# Patient Record
Sex: Female | Born: 1940 | Race: White | Hispanic: No | Smoking: Current every day smoker
Health system: Southern US, Community
[De-identification: ages and names within clinical notes are randomized; demographics above are authoritative.]

## PROBLEM LIST (undated history)

## (undated) DIAGNOSIS — N1832 Chronic kidney disease, stage 3b: Secondary | ICD-10-CM

## (undated) DIAGNOSIS — F17200 Nicotine dependence, unspecified, uncomplicated: Secondary | ICD-10-CM

## (undated) DIAGNOSIS — I10 Essential (primary) hypertension: Secondary | ICD-10-CM

## (undated) DIAGNOSIS — E119 Type 2 diabetes mellitus without complications: Secondary | ICD-10-CM

## (undated) DIAGNOSIS — E785 Hyperlipidemia, unspecified: Secondary | ICD-10-CM

---

## 2000-01-08 ENCOUNTER — Encounter: Payer: Self-pay | Admitting: Family Medicine

## 2000-01-08 ENCOUNTER — Ambulatory Visit (HOSPITAL_COMMUNITY): Admission: RE | Admit: 2000-01-08 | Discharge: 2000-01-08 | Payer: Self-pay | Admitting: Family Medicine

## 2000-01-19 ENCOUNTER — Encounter: Payer: Self-pay | Admitting: *Deleted

## 2000-01-19 ENCOUNTER — Ambulatory Visit (HOSPITAL_COMMUNITY): Admission: RE | Admit: 2000-01-19 | Discharge: 2000-01-19 | Payer: Self-pay | Admitting: *Deleted

## 2020-12-25 DIAGNOSIS — I10 Essential (primary) hypertension: Secondary | ICD-10-CM | POA: Diagnosis not present

## 2020-12-25 DIAGNOSIS — E7800 Pure hypercholesterolemia, unspecified: Secondary | ICD-10-CM | POA: Diagnosis not present

## 2020-12-25 DIAGNOSIS — E1122 Type 2 diabetes mellitus with diabetic chronic kidney disease: Secondary | ICD-10-CM | POA: Diagnosis not present

## 2020-12-25 DIAGNOSIS — N1832 Chronic kidney disease, stage 3b: Secondary | ICD-10-CM | POA: Diagnosis not present

## 2020-12-25 DIAGNOSIS — Z0000 Encounter for general adult medical examination without abnormal findings: Secondary | ICD-10-CM | POA: Diagnosis not present

## 2020-12-25 DIAGNOSIS — F17200 Nicotine dependence, unspecified, uncomplicated: Secondary | ICD-10-CM | POA: Diagnosis not present

## 2020-12-25 DIAGNOSIS — M150 Primary generalized (osteo)arthritis: Secondary | ICD-10-CM | POA: Diagnosis not present

## 2021-08-08 DIAGNOSIS — E1122 Type 2 diabetes mellitus with diabetic chronic kidney disease: Secondary | ICD-10-CM | POA: Diagnosis not present

## 2021-08-08 DIAGNOSIS — I10 Essential (primary) hypertension: Secondary | ICD-10-CM | POA: Diagnosis not present

## 2021-08-08 DIAGNOSIS — N183 Chronic kidney disease, stage 3 unspecified: Secondary | ICD-10-CM | POA: Diagnosis not present

## 2021-08-08 DIAGNOSIS — E7800 Pure hypercholesterolemia, unspecified: Secondary | ICD-10-CM | POA: Diagnosis not present

## 2021-08-08 DIAGNOSIS — F17200 Nicotine dependence, unspecified, uncomplicated: Secondary | ICD-10-CM | POA: Diagnosis not present

## 2021-08-08 DIAGNOSIS — M150 Primary generalized (osteo)arthritis: Secondary | ICD-10-CM | POA: Diagnosis not present

## 2021-12-27 ENCOUNTER — Emergency Department (HOSPITAL_BASED_OUTPATIENT_CLINIC_OR_DEPARTMENT_OTHER)
Admission: EM | Admit: 2021-12-27 | Discharge: 2021-12-27 | Disposition: A | Discharge status: Home / Self Care | Payer: Medicare HMO | Attending: Emergency Medicine | Admitting: Emergency Medicine

## 2021-12-27 ENCOUNTER — Emergency Department (HOSPITAL_BASED_OUTPATIENT_CLINIC_OR_DEPARTMENT_OTHER): Payer: Medicare HMO | Admitting: Radiology

## 2021-12-27 ENCOUNTER — Emergency Department (HOSPITAL_BASED_OUTPATIENT_CLINIC_OR_DEPARTMENT_OTHER): Payer: Medicare HMO

## 2021-12-27 DIAGNOSIS — M25532 Pain in left wrist: Secondary | ICD-10-CM | POA: Diagnosis not present

## 2021-12-27 DIAGNOSIS — S0083XA Contusion of other part of head, initial encounter: Secondary | ICD-10-CM | POA: Diagnosis not present

## 2021-12-27 DIAGNOSIS — I10 Essential (primary) hypertension: Secondary | ICD-10-CM | POA: Diagnosis not present

## 2021-12-27 DIAGNOSIS — S52532A Colles' fracture of left radius, initial encounter for closed fracture: Secondary | ICD-10-CM | POA: Insufficient documentation

## 2021-12-27 DIAGNOSIS — W19XXXA Unspecified fall, initial encounter: Secondary | ICD-10-CM | POA: Diagnosis not present

## 2021-12-27 DIAGNOSIS — S52612A Displaced fracture of left ulna styloid process, initial encounter for closed fracture: Secondary | ICD-10-CM | POA: Diagnosis not present

## 2021-12-27 DIAGNOSIS — J320 Chronic maxillary sinusitis: Secondary | ICD-10-CM | POA: Insufficient documentation

## 2021-12-27 DIAGNOSIS — S52352A Displaced comminuted fracture of shaft of radius, left arm, initial encounter for closed fracture: Secondary | ICD-10-CM | POA: Diagnosis not present

## 2021-12-27 DIAGNOSIS — S6992XA Unspecified injury of left wrist, hand and finger(s), initial encounter: Secondary | ICD-10-CM | POA: Diagnosis present

## 2021-12-27 DIAGNOSIS — R609 Edema, unspecified: Secondary | ICD-10-CM | POA: Diagnosis not present

## 2021-12-27 DIAGNOSIS — I959 Hypotension, unspecified: Secondary | ICD-10-CM | POA: Diagnosis not present

## 2021-12-27 DIAGNOSIS — W010XXA Fall on same level from slipping, tripping and stumbling without subsequent striking against object, initial encounter: Secondary | ICD-10-CM | POA: Diagnosis not present

## 2021-12-27 DIAGNOSIS — S0990XA Unspecified injury of head, initial encounter: Secondary | ICD-10-CM | POA: Diagnosis not present

## 2021-12-27 MED ORDER — OXYCODONE HCL 5 MG PO TABS
5.0000 mg | ORAL_TABLET | Freq: Once | ORAL | Status: AC
  Administered 2021-12-27: 5 mg via ORAL
  Filled 2021-12-27: qty 1

## 2021-12-27 MED ORDER — ACETAMINOPHEN 500 MG PO TABS
1000.0000 mg | ORAL_TABLET | Freq: Once | ORAL | Status: AC
  Administered 2021-12-27: 1000 mg via ORAL
  Filled 2021-12-27: qty 2

## 2021-12-27 MED ORDER — KETOROLAC TROMETHAMINE 15 MG/ML IJ SOLN
15.0000 mg | Freq: Once | INTRAMUSCULAR | Status: AC
Start: 2021-12-27 — End: 2021-12-27
  Administered 2021-12-27: 15 mg via INTRAMUSCULAR
  Filled 2021-12-27: qty 1

## 2021-12-27 NOTE — ED Triage Notes (Signed)
He states she lost her balance (didn't pass out), yesterday evening. Her perception at the time was that she lightly struck her left orbit area. She noted later that her left wrist was "sore; and it got worse and worse". Her left wrist is swollen and ecchymotic. She is awake, alert and oriented x 4 with clear speech.

## 2021-12-27 NOTE — ED Notes (Signed)
Pt discharged home after verbalizing understanding of discharge instructions; nad noted. 

## 2021-12-27 NOTE — ED Notes (Signed)
Pt taken to the restroom with a wheelchair.

## 2021-12-27 NOTE — ED Provider Notes (Signed)
[facility] EMERGENCY DEPT Provider Note   CSN: [id number] Arrival date & time: 12/27/21  1005     History  Chief Complaint  Patient presents with   Fall/wrist pain    Kristin Berry is a 81 y.o. female.  81 yo F with a chief complaints of left wrist pain.  Patient states that she lost her balance yesterday and fell and landed on both of her outstretched arms.  Planing mostly of pain to the left wrist.  She went to bed last night and woke up this morning and felt like it was significantly worse.  She also struck the left side of her face.  She denied confusion denied loss consciousness denied vomiting.  Denies neck pain.  Denies double vision.       Home Medications Prior to Admission medications   Not on File      Allergies    Patient has no allergy information on record.    Review of Systems   Review of Systems  Physical Exam Updated Vital Signs BP (!) 169/59 (BP Location: Right Arm)    Pulse 70    Temp 98.6 F (37 C) (Oral)    Resp 15    LMP  (LMP Unknown)    SpO2 99%  Physical Exam Vitals and nursing note reviewed.  Constitutional:      General: She is not in acute distress.    Appearance: She is well-developed. She is not diaphoretic.  HENT:     Head: Normocephalic.     Comments: Bruising to the superior aspect of the left brow.  No obvious crepitus.  Extraocular motion intact.  Pupils equal round and reactive to light accommodation. Eyes:     Pupils: Pupils are equal, round, and reactive to light.  Cardiovascular:     Rate and Rhythm: Normal rate and regular rhythm.     Heart sounds: No murmur heard.   No friction rub. No gallop.  Pulmonary:     Effort: Pulmonary effort is normal.     Breath sounds: No wheezing or rales.  Abdominal:     General: There is no distension.     Palpations: Abdomen is soft.     Tenderness: There is no abdominal tenderness.  Musculoskeletal:        General: Swelling, tenderness and deformity present.      Cervical back: Normal range of motion and neck supple.     Comments: Pain and swelling to the left wrist.  Pulse motor and sensation intact distally.  No pain at the elbow.  Skin:    General: Skin is warm and dry.  Neurological:     Mental Status: She is alert and oriented to person, place, and time.  Psychiatric:        Behavior: Behavior normal.    ED Results / Procedures / Treatments   Labs (all labs ordered are listed, but only abnormal results are displayed) Labs Reviewed - No data to display  EKG None  Radiology DG Wrist Complete Left  Result Date: 12/27/2021 CLINICAL DATA:  Trauma, fall EXAM: LEFT WRIST - COMPLETE 3+ VIEW COMPARISON:  None. FINDINGS: There is severely comminuted fracture in the distal radius. Fracture lines are extending to the articular surface. There is some over riding of fracture fragments in the dorsal aspect. Fracture is seen in the ulnar styloid. Degenerative changes are noted in first carpometacarpal joint. IMPRESSION: Comminuted displaced fracture is seen in the distal left radius. Fracture lines are involving the articular surface.  There is avulsion fracture of ulnar styloid. Electronically Signed   By: Elmer Picker M.D.   On: 12/27/2021 10:59   CT Head Wo Contrast  Result Date: 12/27/2021 CLINICAL DATA:  Trauma, fall EXAM: CT HEAD WITHOUT CONTRAST TECHNIQUE: Contiguous axial images were obtained from the base of the skull through the vertex without intravenous contrast. RADIATION DOSE REDUCTION: This exam was performed according to the departmental dose-optimization program which includes automated exposure control, adjustment of the mA and/or kV according to patient size and/or use of iterative reconstruction technique. COMPARISON:  None. FINDINGS: Brain: No acute intracranial findings are seen. Cortical sulci are prominent. There is decreased density in the periventricular white matter. Vascular: Unremarkable. Skull: No fracture is seen.  Sinuses/Orbits: There is mucosal thickening in the left maxillary sinus. Other: There is increased amount of CSF insula suggesting partial empty sella. IMPRESSION: No acute intracranial findings are seen in noncontrast CT brain. Atrophy. Chronic left maxillary sinusitis. Electronically Signed   By: Elmer Picker M.D.   On: 12/27/2021 11:01    Procedures Procedures  SPLINT APPLICATION Date/Time: [id number] PM Authorized by: Cecilio Asper Consent: Verbal consent obtained. Risks and benefits: risks, benefits and alternatives were discussed Consent given by: patient Splint applied by: EMT Location details: L wrist Splint type: sugar tong Supplies used: orthoglass Post-procedure: The splinted body part was neurovascularly unchanged following the procedure. Patient tolerance: Patient tolerated the procedure well with no immediate complications.     Medications Ordered in ED Medications  acetaminophen (TYLENOL) tablet 1,000 mg (1,000 mg Oral Given 12/27/21 1107)  oxyCODONE (Oxy IR/ROXICODONE) immediate release tablet 5 mg (5 mg Oral Given 12/27/21 1107)  ketorolac (TORADOL) 15 MG/ML injection 15 mg (15 mg Intramuscular Given 12/27/21 1126)    ED Course/ Medical Decision Making/ A&P                           Medical Decision Making Amount and/or Complexity of Data Reviewed Radiology: ordered.  Risk OTC drugs. Prescription drug management.   Patient is a 81 y.o. female with a cc of a fall.  Patient states that she lost her balance and landed on outstretched arms.  Complaining mostly of left wrist pain.  Plain film viewed by me and independently interpreted with distal radius fracture without significant displacement.  We will place in a sugar-tong splint here.  Hand follow-up.  She also has some signs of head trauma.  She denies any significant symptoms.  CT of the head was negative for acute intracranial pathology.  We will discharge the patient home.  PCP follow-up.  PDMP  reviewed.  Patient does have a history of chronic narcotic use.  We will not add any pain medicine to her regimen at this time.  12:25 PM:  I have discussed the diagnosis/risks/treatment options with the patient.  Evaluation and diagnostic testing in the emergency department does not suggest an emergent condition requiring admission or immediate intervention beyond what has been performed at this time.  They will follow up with  PCP, hand. We also discussed returning to the ED immediately if new or worsening sx occur. We discussed the sx which are most concerning (e.g., sudden worsening pain, fever, inability to tolerate by mouth) that necessitate immediate return. Medications administered to the patient during their visit and any new prescriptions provided to the patient are listed below.  Medications given during this visit Medications  acetaminophen (TYLENOL) tablet 1,000 mg (1,000 mg Oral Given 12/27/21 1107)  oxyCODONE (Oxy IR/ROXICODONE) immediate release tablet 5 mg (5 mg Oral Given 12/27/21 1107)  ketorolac (TORADOL) 15 MG/ML injection 15 mg (15 mg Intramuscular Given 12/27/21 1126)     The patient appears reasonably screen and/or stabilized for discharge and I doubt any other medical condition or other [facility] requiring further screening, evaluation, or treatment in the ED at this time prior to discharge.           Final Clinical Impression(s) / ED Diagnoses Final diagnoses:  Closed Colles' fracture of left radius, initial encounter    Rx / DC Orders ED Discharge Orders     None         Deno Etienne, DO 12/27/21 1225

## 2021-12-27 NOTE — Discharge Instructions (Addendum)
Take 4 over the counter ibuprofen tablets 3 times a day or 2 over-the-counter naproxen tablets twice a day for pain.  Then take the pain medicine if you feel like you need it. Narcotics do not help with the pain, they only make you care about it less.  You can become addicted to this, people may break into your house to steal it.  It will constipate you.  If you drive under the influence of this medicine you can get a DUI.    

## 2022-01-01 ENCOUNTER — Other Ambulatory Visit: Payer: Self-pay | Admitting: Family Medicine

## 2022-01-01 DIAGNOSIS — S52539S Colles' fracture of unspecified radius, sequela: Secondary | ICD-10-CM

## 2022-01-02 ENCOUNTER — Emergency Department (HOSPITAL_COMMUNITY): Payer: Medicare HMO

## 2022-01-02 ENCOUNTER — Encounter (HOSPITAL_COMMUNITY): Payer: Self-pay | Admitting: Internal Medicine

## 2022-01-02 ENCOUNTER — Other Ambulatory Visit: Payer: Self-pay

## 2022-01-02 ENCOUNTER — Inpatient Hospital Stay (HOSPITAL_COMMUNITY)
Admission: EM | Admit: 2022-01-02 | Discharge: 2022-01-05 | DRG: 690 | Disposition: A | Discharge status: Home Health | Payer: Medicare HMO | Attending: Internal Medicine | Admitting: Internal Medicine

## 2022-01-02 DIAGNOSIS — E041 Nontoxic single thyroid nodule: Secondary | ICD-10-CM | POA: Diagnosis not present

## 2022-01-02 DIAGNOSIS — Z20822 Contact with and (suspected) exposure to covid-19: Secondary | ICD-10-CM | POA: Diagnosis present

## 2022-01-02 DIAGNOSIS — M47812 Spondylosis without myelopathy or radiculopathy, cervical region: Secondary | ICD-10-CM | POA: Diagnosis not present

## 2022-01-02 DIAGNOSIS — E1122 Type 2 diabetes mellitus with diabetic chronic kidney disease: Secondary | ICD-10-CM | POA: Diagnosis not present

## 2022-01-02 DIAGNOSIS — G319 Degenerative disease of nervous system, unspecified: Secondary | ICD-10-CM | POA: Diagnosis not present

## 2022-01-02 DIAGNOSIS — Z66 Do not resuscitate: Secondary | ICD-10-CM | POA: Diagnosis present

## 2022-01-02 DIAGNOSIS — S199XXA Unspecified injury of neck, initial encounter: Secondary | ICD-10-CM | POA: Diagnosis not present

## 2022-01-02 DIAGNOSIS — S52539A Colles' fracture of unspecified radius, initial encounter for closed fracture: Secondary | ICD-10-CM | POA: Diagnosis present

## 2022-01-02 DIAGNOSIS — W010XXA Fall on same level from slipping, tripping and stumbling without subsequent striking against object, initial encounter: Secondary | ICD-10-CM | POA: Diagnosis present

## 2022-01-02 DIAGNOSIS — R269 Unspecified abnormalities of gait and mobility: Secondary | ICD-10-CM | POA: Diagnosis present

## 2022-01-02 DIAGNOSIS — M25572 Pain in left ankle and joints of left foot: Secondary | ICD-10-CM | POA: Diagnosis not present

## 2022-01-02 DIAGNOSIS — I129 Hypertensive chronic kidney disease with stage 1 through stage 4 chronic kidney disease, or unspecified chronic kidney disease: Secondary | ICD-10-CM | POA: Diagnosis present

## 2022-01-02 DIAGNOSIS — I959 Hypotension, unspecified: Secondary | ICD-10-CM | POA: Diagnosis not present

## 2022-01-02 DIAGNOSIS — J3489 Other specified disorders of nose and nasal sinuses: Secondary | ICD-10-CM | POA: Diagnosis not present

## 2022-01-02 DIAGNOSIS — F17210 Nicotine dependence, cigarettes, uncomplicated: Secondary | ICD-10-CM | POA: Diagnosis present

## 2022-01-02 DIAGNOSIS — N179 Acute kidney failure, unspecified: Secondary | ICD-10-CM | POA: Diagnosis present

## 2022-01-02 DIAGNOSIS — N390 Urinary tract infection, site not specified: Principal | ICD-10-CM | POA: Diagnosis present

## 2022-01-02 DIAGNOSIS — S52532A Colles' fracture of left radius, initial encounter for closed fracture: Secondary | ICD-10-CM | POA: Diagnosis present

## 2022-01-02 DIAGNOSIS — R296 Repeated falls: Secondary | ICD-10-CM | POA: Diagnosis present

## 2022-01-02 DIAGNOSIS — R262 Difficulty in walking, not elsewhere classified: Secondary | ICD-10-CM | POA: Diagnosis present

## 2022-01-02 DIAGNOSIS — N1832 Chronic kidney disease, stage 3b: Secondary | ICD-10-CM | POA: Diagnosis present

## 2022-01-02 DIAGNOSIS — Z885 Allergy status to narcotic agent status: Secondary | ICD-10-CM | POA: Diagnosis not present

## 2022-01-02 DIAGNOSIS — D631 Anemia in chronic kidney disease: Secondary | ICD-10-CM | POA: Diagnosis present

## 2022-01-02 DIAGNOSIS — I700 Atherosclerosis of aorta: Secondary | ICD-10-CM | POA: Diagnosis not present

## 2022-01-02 DIAGNOSIS — E785 Hyperlipidemia, unspecified: Secondary | ICD-10-CM | POA: Diagnosis present

## 2022-01-02 DIAGNOSIS — F17200 Nicotine dependence, unspecified, uncomplicated: Secondary | ICD-10-CM | POA: Diagnosis present

## 2022-01-02 DIAGNOSIS — M25551 Pain in right hip: Secondary | ICD-10-CM | POA: Diagnosis not present

## 2022-01-02 DIAGNOSIS — W19XXXA Unspecified fall, initial encounter: Secondary | ICD-10-CM | POA: Diagnosis not present

## 2022-01-02 DIAGNOSIS — Z716 Tobacco abuse counseling: Secondary | ICD-10-CM | POA: Diagnosis not present

## 2022-01-02 DIAGNOSIS — I10 Essential (primary) hypertension: Secondary | ICD-10-CM | POA: Diagnosis present

## 2022-01-02 DIAGNOSIS — N189 Chronic kidney disease, unspecified: Secondary | ICD-10-CM | POA: Diagnosis present

## 2022-01-02 DIAGNOSIS — S0003XA Contusion of scalp, initial encounter: Secondary | ICD-10-CM | POA: Diagnosis not present

## 2022-01-02 DIAGNOSIS — J341 Cyst and mucocele of nose and nasal sinus: Secondary | ICD-10-CM | POA: Diagnosis not present

## 2022-01-02 DIAGNOSIS — R9431 Abnormal electrocardiogram [ECG] [EKG]: Secondary | ICD-10-CM | POA: Diagnosis not present

## 2022-01-02 DIAGNOSIS — E119 Type 2 diabetes mellitus without complications: Secondary | ICD-10-CM

## 2022-01-02 DIAGNOSIS — J439 Emphysema, unspecified: Secondary | ICD-10-CM | POA: Diagnosis not present

## 2022-01-02 HISTORY — DX: Nicotine dependence, unspecified, uncomplicated: F17.200

## 2022-01-02 HISTORY — DX: Essential (primary) hypertension: I10

## 2022-01-02 HISTORY — DX: Hyperlipidemia, unspecified: E78.5

## 2022-01-02 HISTORY — DX: Type 2 diabetes mellitus without complications: E11.9

## 2022-01-02 HISTORY — DX: Chronic kidney disease, stage 3b: N18.32

## 2022-01-02 LAB — COMPREHENSIVE METABOLIC PANEL
ALT: 13 U/L (ref 0–44)
AST: 14 U/L — ABNORMAL LOW (ref 15–41)
Albumin: 3.2 g/dL — ABNORMAL LOW (ref 3.5–5.0)
Alkaline Phosphatase: 101 U/L (ref 38–126)
Anion gap: 14 (ref 5–15)
BUN: 68 mg/dL — ABNORMAL HIGH (ref 8–23)
CO2: 14 mmol/L — ABNORMAL LOW (ref 22–32)
Calcium: 9.8 mg/dL (ref 8.9–10.3)
Chloride: 109 mmol/L (ref 98–111)
Creatinine, Ser: 2.82 mg/dL — ABNORMAL HIGH (ref 0.44–1.00)
GFR, Estimated: 16 mL/min — ABNORMAL LOW (ref >60)
Glucose, Bld: 192 mg/dL — ABNORMAL HIGH (ref 70–99)
Potassium: 3.9 mmol/L (ref 3.5–5.1)
Sodium: 137 mmol/L (ref 135–145)
Total Bilirubin: 0.5 mg/dL (ref 0.3–1.2)
Total Protein: 5.8 g/dL — ABNORMAL LOW (ref 6.5–8.1)

## 2022-01-02 LAB — CBC WITH DIFFERENTIAL/PLATELET
Abs Immature Granulocytes: 0.06 10*3/uL (ref 0.00–0.07)
Basophils Absolute: 0 10*3/uL (ref 0.0–0.1)
Basophils Relative: 0 %
Eosinophils Absolute: 0 10*3/uL (ref 0.0–0.5)
Eosinophils Relative: 0 %
HCT: 28.5 % — ABNORMAL LOW (ref 36.0–46.0)
Hemoglobin: 9.5 g/dL — ABNORMAL LOW (ref 12.0–15.0)
Immature Granulocytes: 1 %
Lymphocytes Relative: 7 %
Lymphs Abs: 0.8 10*3/uL (ref 0.7–4.0)
MCH: 31.1 pg (ref 26.0–34.0)
MCHC: 33.3 g/dL (ref 30.0–36.0)
MCV: 93.4 fL (ref 80.0–100.0)
Monocytes Absolute: 1.3 10*3/uL — ABNORMAL HIGH (ref 0.1–1.0)
Monocytes Relative: 12 %
Neutro Abs: 9.2 10*3/uL — ABNORMAL HIGH (ref 1.7–7.7)
Neutrophils Relative %: 80 %
Platelets: 165 10*3/uL (ref 150–400)
RBC: 3.05 MIL/uL — ABNORMAL LOW (ref 3.87–5.11)
RDW: 12.2 % (ref 11.5–15.5)
WBC: 11.4 10*3/uL — ABNORMAL HIGH (ref 4.0–10.5)
nRBC: 0 % (ref 0.0–0.2)

## 2022-01-02 LAB — URINALYSIS, ROUTINE W REFLEX MICROSCOPIC
Bilirubin Urine: NEGATIVE
Glucose, UA: NEGATIVE mg/dL
Ketones, ur: NEGATIVE mg/dL
Nitrite: NEGATIVE
Protein, ur: 100 mg/dL — AB
Specific Gravity, Urine: 1.014 (ref 1.005–1.030)
pH: 5 (ref 5.0–8.0)

## 2022-01-02 LAB — HEMOGLOBIN A1C
Hgb A1c MFr Bld: 6.5 % — ABNORMAL HIGH (ref 4.8–5.6)
Mean Plasma Glucose: 139.85 mg/dL

## 2022-01-02 LAB — TSH: TSH: 0.293 u[IU]/mL — ABNORMAL LOW (ref 0.350–4.500)

## 2022-01-02 LAB — RESP PANEL BY RT-PCR (FLU A&B, COVID) ARPGX2
Influenza A by PCR: NEGATIVE
Influenza B by PCR: NEGATIVE
SARS Coronavirus 2 by RT PCR: NEGATIVE

## 2022-01-02 LAB — PROTIME-INR
INR: 0.9 (ref 0.8–1.2)
Prothrombin Time: 12.2 seconds (ref 11.4–15.2)

## 2022-01-02 LAB — GLUCOSE, CAPILLARY
Glucose-Capillary: 101 mg/dL — ABNORMAL HIGH (ref 70–99)
Glucose-Capillary: 95 mg/dL (ref 70–99)

## 2022-01-02 LAB — CBG MONITORING, ED: Glucose-Capillary: 167 mg/dL — ABNORMAL HIGH (ref 70–99)

## 2022-01-02 MED ORDER — ATORVASTATIN CALCIUM 40 MG PO TABS
40.0000 mg | ORAL_TABLET | Freq: Every day | ORAL | Status: DC
  Administered 2022-01-02 – 2022-01-05 (×4): 40 mg via ORAL
  Filled 2022-01-02 (×4): qty 1

## 2022-01-02 MED ORDER — ONDANSETRON HCL 4 MG PO TABS: 4.0000 mg | ORAL_TABLET | Freq: Four times a day (QID) | ORAL | Status: DC | PRN

## 2022-01-02 MED ORDER — POLYETHYLENE GLYCOL 3350 17 G PO PACK: 17.0000 g | PACK | Freq: Every day | ORAL | Status: DC | PRN

## 2022-01-02 MED ORDER — ENOXAPARIN SODIUM 30 MG/0.3ML IJ SOSY
30.0000 mg | PREFILLED_SYRINGE | INTRAMUSCULAR | Status: DC
  Administered 2022-01-02 – 2022-01-05 (×4): 30 mg via SUBCUTANEOUS
  Filled 2022-01-02 (×4): qty 0.3

## 2022-01-02 MED ORDER — MORPHINE SULFATE (PF) 2 MG/ML IV SOLN: 2.0000 mg | INTRAVENOUS | Status: DC | PRN

## 2022-01-02 MED ORDER — LACTATED RINGERS IV SOLN: INTRAVENOUS | Status: DC

## 2022-01-02 MED ORDER — POLYVINYL ALCOHOL 1.4 % OP SOLN
1.0000 [drp] | Freq: Three times a day (TID) | OPHTHALMIC | Status: DC | PRN
  Filled 2022-01-02: qty 15

## 2022-01-02 MED ORDER — ALBUTEROL SULFATE (2.5 MG/3ML) 0.083% IN NEBU: 2.5000 mg | INHALATION_SOLUTION | RESPIRATORY_TRACT | Status: DC | PRN

## 2022-01-02 MED ORDER — ASPIRIN 81 MG PO CHEW
81.0000 mg | CHEWABLE_TABLET | Freq: Every day | ORAL | Status: DC
  Administered 2022-01-02 – 2022-01-05 (×4): 81 mg via ORAL
  Filled 2022-01-02 (×4): qty 1

## 2022-01-02 MED ORDER — BISACODYL 5 MG PO TBEC: 5.0000 mg | DELAYED_RELEASE_TABLET | Freq: Every day | ORAL | Status: DC | PRN

## 2022-01-02 MED ORDER — SODIUM CHLORIDE 0.9 % IV SOLN: Freq: Once | INTRAVENOUS | Status: AC

## 2022-01-02 MED ORDER — DOCUSATE SODIUM 100 MG PO CAPS
100.0000 mg | ORAL_CAPSULE | Freq: Two times a day (BID) | ORAL | Status: DC
  Administered 2022-01-02 – 2022-01-05 (×7): 100 mg via ORAL
  Filled 2022-01-02 (×7): qty 1

## 2022-01-02 MED ORDER — SODIUM CHLORIDE 0.9 % IV SOLN
1.0000 g | INTRAVENOUS | Status: DC
  Administered 2022-01-03 – 2022-01-05 (×3): 1 g via INTRAVENOUS
  Filled 2022-01-02 (×3): qty 10

## 2022-01-02 MED ORDER — SODIUM CHLORIDE 0.9 % IV SOLN
1.0000 g | Freq: Once | INTRAVENOUS | Status: AC
  Administered 2022-01-02: 1 g via INTRAVENOUS
  Filled 2022-01-02: qty 10

## 2022-01-02 MED ORDER — INSULIN ASPART 100 UNIT/ML IJ SOLN: 0.0000 [IU] | Freq: Every day | INTRAMUSCULAR | Status: DC

## 2022-01-02 MED ORDER — ATENOLOL 50 MG PO TABS
50.0000 mg | ORAL_TABLET | Freq: Two times a day (BID) | ORAL | Status: DC
  Administered 2022-01-02 – 2022-01-05 (×7): 50 mg via ORAL
  Filled 2022-01-02 (×7): qty 1

## 2022-01-02 MED ORDER — ACETAMINOPHEN 650 MG RE SUPP: 650.0000 mg | Freq: Four times a day (QID) | RECTAL | Status: DC | PRN

## 2022-01-02 MED ORDER — OXYCODONE HCL 5 MG PO TABS
5.0000 mg | ORAL_TABLET | ORAL | Status: DC | PRN
  Administered 2022-01-02 (×2): 5 mg via ORAL
  Filled 2022-01-02 (×2): qty 1

## 2022-01-02 MED ORDER — AMLODIPINE BESYLATE 10 MG PO TABS
10.0000 mg | ORAL_TABLET | Freq: Every day | ORAL | Status: DC
  Administered 2022-01-02 – 2022-01-05 (×4): 10 mg via ORAL
  Filled 2022-01-02 (×4): qty 1

## 2022-01-02 MED ORDER — ACETAMINOPHEN 325 MG PO TABS
650.0000 mg | ORAL_TABLET | Freq: Four times a day (QID) | ORAL | Status: DC | PRN
  Administered 2022-01-02: 650 mg via ORAL
  Filled 2022-01-02: qty 2

## 2022-01-02 MED ORDER — HYDRALAZINE HCL 20 MG/ML IJ SOLN: 5.0000 mg | INTRAMUSCULAR | Status: DC | PRN

## 2022-01-02 MED ORDER — INSULIN ASPART 100 UNIT/ML IJ SOLN
0.0000 [IU] | Freq: Three times a day (TID) | INTRAMUSCULAR | Status: DC
  Administered 2022-01-02: 2 [IU] via SUBCUTANEOUS
  Administered 2022-01-03: 1 [IU] via SUBCUTANEOUS
  Administered 2022-01-03: 3 [IU] via SUBCUTANEOUS
  Administered 2022-01-04: 2 [IU] via SUBCUTANEOUS
  Administered 2022-01-04: 1 [IU] via SUBCUTANEOUS
  Administered 2022-01-05: 2 [IU] via SUBCUTANEOUS

## 2022-01-02 MED ORDER — ONDANSETRON HCL 4 MG/2ML IJ SOLN: 4.0000 mg | Freq: Four times a day (QID) | INTRAMUSCULAR | Status: DC | PRN

## 2022-01-02 MED ORDER — NICOTINE 14 MG/24HR TD PT24
14.0000 mg | MEDICATED_PATCH | Freq: Every day | TRANSDERMAL | Status: DC
  Administered 2022-01-02 – 2022-01-03 (×2): 14 mg via TRANSDERMAL
  Filled 2022-01-02 (×4): qty 1

## 2022-01-02 NOTE — Assessment & Plan Note (Signed)
-  Patient presenting with weakness, recurrent falls -She has baseline stage 3b CKD -Currently with significantly worse renal function -Likely secondary to poor PO intake -Will hydrate and follow -Observation on med surg for now

## 2022-01-02 NOTE — Assessment & Plan Note (Signed)
-  Check TSH -Needs thyroid US as inpatient vs. outpatient

## 2022-01-02 NOTE — H&P (Addendum)
History and Physical    Patient: Kristin Berry AXE:[id number] DOB: Dec 27, 1940 DOA: 01/02/2022 DOS: the patient was seen and examined on 01/02/2022 PCP: Johny Blamer, MD  Patient coming from: Home - lives alone; NOK: Grandson, [place], don't have current number   Chief Complaint: Recurrent falls  HPI: Kristin Berry is a 81 y.o. female with medical history significant of DM; stage 3b CKD; HTN; and HLD presenting with recurrent falls.  She reports that she has been falling lately. This started on Christmas Day.  She is not sure why she is falling, but since Christmas her right side has been weaker.  She has been using a walker and pain medicine has helped.  She fell on 2/3 and broke her wrist and has been trying to get around since then.  Unless she has pain medication on board, she has difficulty.  She thinks she needs to go live with a grandson.  She is not light-headed or dizzy.  She hasn't noticed urinary symptoms but she is having urgency and then hesitation while in the ER.  No difficulty speaking or swallowing.    ER Course:  Fall on 2/4, radius fracture.  Here today with another fall.  Appears to have AKI.  UA with ?UTI.  Will call ortho and give Rocephin.     Review of Systems: As mentioned in the history of present illness. All other systems reviewed and are negative. Past Medical History:  Diagnosis Date   Diabetes mellitus type 2 in nonobese (HCC)    Dyslipidemia    Hypertension    Stage 3b chronic kidney disease (CKD) (HCC)    Tobacco dependence    History reviewed. No pertinent surgical history. Social History:  reports that she has been smoking cigarettes. She has a 58.00 pack-year smoking history. She has never used smokeless tobacco. She reports that she does not currently use alcohol. She reports that she does not use drugs.  Allergies  Allergen Reactions   Codeine Nausea And Vomiting   Tramadol Hcl Itching    History reviewed. No pertinent family  history.  Prior to Admission medications   Not on File    Physical Exam: Vitals:   01/02/22 1000 01/02/22 1115 01/02/22 1230 01/02/22 1300  BP: (!) 132/39 (!) 127/43 (!) 111/43 (!) 114/53  Pulse: 61 65 64 60  Resp: 15 19 13 14   Temp:      TempSrc:      SpO2: 100% 100% 100% 100%   General:  Appears calm and comfortable and is in NAD, frail Eyes:  PERRL, EOMI, L periorbital hematoma ENT:  grossly normal hearing, lips & tongue, mmm Neck:  no LAD, masses or thyromegaly Cardiovascular:  RRR, no m/r/g. No LE edema.  Respiratory:   CTA bilaterally with no wheezes/rales/rhonchi.  Normal respiratory effort. Abdomen:  soft, NT, ND Skin:  no rash or induration seen on limited exam, few scattered ecchymoses Musculoskeletal:  L wrist is splinted Psychiatric:  blunted mood and affect, speech fluent and appropriate, AOx3 Neurologic:  CN 2-12 grossly intact, moves all extremities in coordinated fashion   Radiological Exams on Admission: Independently reviewed - see discussion in A/P where applicable  DG Chest 1 View  Result Date: 01/02/2022 CLINICAL DATA:  81 year old female with history of pain in the lateral aspect of the right hip. EXAM: CHEST  1 VIEW COMPARISON:  No priors. FINDINGS: Lung volumes are normal. No consolidative airspace disease. No pleural effusions. No pneumothorax. No pulmonary nodule or mass noted. Pulmonary  vasculature and the cardiomediastinal silhouette are within normal limits. Atherosclerosis in the thoracic aorta. IMPRESSION: 1.  No radiographic evidence of acute cardiopulmonary disease. 2. Aortic atherosclerosis. Electronically Signed   By: Vinnie Langton M.D.   On: 01/02/2022 07:54   CT Head Wo Contrast  Result Date: 01/02/2022 CLINICAL DATA:  Head trauma, minor (Age >= 65y); Neck trauma (Age >= 65y) EXAM: CT HEAD WITHOUT CONTRAST CT CERVICAL SPINE WITHOUT CONTRAST TECHNIQUE: Multidetector CT imaging of the head and cervical spine was performed following the  standard protocol without intravenous contrast. Multiplanar CT image reconstructions of the cervical spine were also generated. RADIATION DOSE REDUCTION: This exam was performed according to the departmental dose-optimization program which includes automated exposure control, adjustment of the mA and/or kV according to patient size and/or use of iterative reconstruction technique. COMPARISON:  CT head February 4, 23. FINDINGS: CT HEAD FINDINGS Brain: No evidence of acute infarction, hemorrhage, hydrocephalus, extra-axial collection or mass lesion/mass effect. Patchy white matter hypoattenuation, nonspecific but compatible with chronic microvascular ischemic disease. Mild for age atrophy. Partially empty and expanded sella. Vascular: Calcific atherosclerosis. Skull: Left periorbital contusion.  No visible fracture. Sinuses/Orbits: Mild paranasal sinus mucosal thickening. Left maxillary sinus retention cyst. Other: No mastoid effusions. CT CERVICAL SPINE FINDINGS Alignment: No substantial sagittal subluxation. Skull base and vertebrae: Vertebral body heights are maintained. No evidence of acute fracture. Soft tissues and spinal canal: No prevertebral fluid or swelling. No visible canal hematoma. Disc levels: Multilevel degenerative disc disease, greatest at C5-C6 and C6-C7 with disc height loss, endplate sclerosis and posterior disc/osteophyte complexes. Multilevel facet and uncovertebral hypertrophy with resulting varying degrees of neural foraminal stenosis. Upper chest: Biapical pleuroparenchymal scarring.  Emphysema. Other: Partially imaged right posterior thyroid nodule, measuring 1.7 cm. Bulky calcific atherosclerosis at bilateral carotid bifurcations. IMPRESSION: CT head: 1. No evidence of acute intracranial abnormality. 2. Chronic microvascular disease. CT cervical spine: 1. No evidence of acute fracture or traumatic malalignment. 2. Moderate multilevel degenerative change. 3. Bulky calcific atherosclerosis at  bilateral carotid bifurcations. Carotid ultrasound or CTA neck could evaluate for potentially significant stenosis if clinically indicated. 4. Partially imaged right posterior thyroid nodule measuring 1.7 cm. Recommend thyroid US (ref: J Am Coll Radiol. 2015 Feb;12(2): 143-50). Electronically Signed   By: Margaretha Sheffield M.D.   On: 01/02/2022 08:56   CT Cervical Spine Wo Contrast  Result Date: 01/02/2022 CLINICAL DATA:  Head trauma, minor (Age >= 65y); Neck trauma (Age >= 65y) EXAM: CT HEAD WITHOUT CONTRAST CT CERVICAL SPINE WITHOUT CONTRAST TECHNIQUE: Multidetector CT imaging of the head and cervical spine was performed following the standard protocol without intravenous contrast. Multiplanar CT image reconstructions of the cervical spine were also generated. RADIATION DOSE REDUCTION: This exam was performed according to the departmental dose-optimization program which includes automated exposure control, adjustment of the mA and/or kV according to patient size and/or use of iterative reconstruction technique. COMPARISON:  CT head February 4, 23. FINDINGS: CT HEAD FINDINGS Brain: No evidence of acute infarction, hemorrhage, hydrocephalus, extra-axial collection or mass lesion/mass effect. Patchy white matter hypoattenuation, nonspecific but compatible with chronic microvascular ischemic disease. Mild for age atrophy. Partially empty and expanded sella. Vascular: Calcific atherosclerosis. Skull: Left periorbital contusion.  No visible fracture. Sinuses/Orbits: Mild paranasal sinus mucosal thickening. Left maxillary sinus retention cyst. Other: No mastoid effusions. CT CERVICAL SPINE FINDINGS Alignment: No substantial sagittal subluxation. Skull base and vertebrae: Vertebral body heights are maintained. No evidence of acute fracture. Soft tissues and spinal canal: No prevertebral fluid or swelling.  No visible canal hematoma. Disc levels: Multilevel degenerative disc disease, greatest at C5-C6 and C6-C7 with  disc height loss, endplate sclerosis and posterior disc/osteophyte complexes. Multilevel facet and uncovertebral hypertrophy with resulting varying degrees of neural foraminal stenosis. Upper chest: Biapical pleuroparenchymal scarring.  Emphysema. Other: Partially imaged right posterior thyroid nodule, measuring 1.7 cm. Bulky calcific atherosclerosis at bilateral carotid bifurcations. IMPRESSION: CT head: 1. No evidence of acute intracranial abnormality. 2. Chronic microvascular disease. CT cervical spine: 1. No evidence of acute fracture or traumatic malalignment. 2. Moderate multilevel degenerative change. 3. Bulky calcific atherosclerosis at bilateral carotid bifurcations. Carotid ultrasound or CTA neck could evaluate for potentially significant stenosis if clinically indicated. 4. Partially imaged right posterior thyroid nodule measuring 1.7 cm. Recommend thyroid US (ref: J Am Coll Radiol. 2015 Feb;12(2): 143-50). Electronically Signed   By: Margaretha Sheffield M.D.   On: 01/02/2022 08:56   DG Hip Unilat W or Wo Pelvis 2-3 Views Right  Result Date: 01/02/2022 CLINICAL DATA:  Right hip pain after fall. EXAM: DG HIP (WITH OR WITHOUT PELVIS) 2-3V RIGHT COMPARISON:  None. FINDINGS: No acute fracture or dislocation. 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 Mild-to-moderate left hip joint space narrowing. The right hip joint space is preserved. The pubic symphysis and sacroiliac joints are intact. Soft tissues are unremarkable. IMPRESSION: 1. No acute osseous abnormality. 2. 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 Electronically Signed   By: Titus Dubin M.D.   On: 01/02/2022 07:58    EKG: Independently reviewed.  NSR with rate 62; no evidence of acute ischemia   Labs on Admission: I have personally reviewed the available labs and imaging studies at the time  of the admission.  Pertinent labs:    CO2 14 Glucose 192 BUN 68/Creatinine 2.82/GFR 16; 22/1.36/39 in 07/2021 WBC 11.4 Hgb 9.5; 11.3 in 07/2021 UA: moderate Hgb, trace LE, 100 protein, many bacteria    Assessment and Plan: * AKI (acute kidney injury) ([place])- (present on admission) -Patient presenting with weakness, recurrent falls -She has baseline stage 3b CKD -Currently with significantly worse renal function -Likely secondary to poor PO intake -Will hydrate and follow -Observation on med surg for now  DNR (do not resuscitate)- (present on admission) -I have discussed code status with the patient and she would not desire resuscitation and would prefer to die a natural death should that situation arise. -She will need a gold out of facility DNR form at the time of discharge  Colles' fracture- (present on admission) -Recurrent falls -L wrist fracture, diagnosed in the ER on 2/4 -Sugar tong splint was placed at that time  -Needs to be NWB -F/u with Dr. Caralyn Guile in 2 weeks (from time of fracture) -Discussed with ortho, Silvestre Gunner  Thyroid nodule- (present on admission) -Check TSH -Needs thyroid US as inpatient vs. 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   -This was discussed with the patient and should be reviewed on an ongoing basis.   -  Patch ordered at patient request.  Stage 3b chronic kidney disease (CKD) ([place])- (present on admission) -Baseline  stage 3b CKD -Currently with AKI (see below) -Will follow -Attempt to avoid nephrotoxic medications  Hypertension- (present on admission) -Hold Hyzaar -Continue Norvasc, atenolol   Dyslipidemia- (present on admission) -Continue Lipitor  Diabetes mellitus type 2 in nonobese (HCC) -Will check A1c -She does not appear to be taking home medications -Cover with moderate-scale SSI     Advance Care Planning:   Code Status: DNR   Consults: PT/OT/ST; nutrition; TOC team  DVT Prophylaxis: Lovenox  Family Communication: None present; her grandson has new telephone number and she doesn't currently have it, but he is reportedly aware of her hospitalization  Severity of Illness: The appropriate patient status for this patient is OBSERVATION. Observation status is judged to be reasonable and necessary in order to provide the required intensity of service to ensure the patient's safety. The patient's presenting symptoms, physical exam findings, and initial radiographic and laboratory data in the context of their medical condition is felt to place them at decreased risk for further clinical deterioration. Furthermore, it is anticipated that the patient will be medically stable for discharge from the hospital within 2 midnights of admission.   Author: Karmen Bongo, MD 01/02/2022 2:00 PM  For on call review [email or web address].

## 2022-01-02 NOTE — ED Notes (Signed)
Pt resting in stretcher. RN gave PO meds, checked sugar, repositioned and provided pt with [place] sandwich, snacks and drinks. Pt denies further needs. In no distress. Call light in reach.

## 2022-01-02 NOTE — Assessment & Plan Note (Signed)
-  Urinary symptoms started today -UA is abnormal -Culture is pending -She was given Rocephin in the ER; will continue daily to treat pending culture results

## 2022-01-02 NOTE — Progress Notes (Signed)
SLP Cancellation Note  Patient Details Name: ENIS LEATHERWOOD MRN: [id number] DOB: 01-07-1941   Cancelled treatment:       Reason Eval/Treat Not Completed: Other (comment). Pt passed swallow screen, CT negative for acute finding. Will defer SLP eval.    Alann Avey, Riley Nearing 01/02/2022, 12:44 PM

## 2022-01-02 NOTE — Assessment & Plan Note (Signed)
-  Encourage cessation.   °-This was discussed with the patient and should be reviewed on an ongoing basis.   °-Patch ordered at patient request. °

## 2022-01-02 NOTE — Assessment & Plan Note (Addendum)
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

## 2022-01-02 NOTE — Assessment & Plan Note (Signed)
-  Recurrent falls -L wrist fracture, diagnosed in the ER on 2/4 -Sugar tong splint was placed at that time  -Needs to be NWB -F/u with Dr. Melvyn Novas in 2 weeks (from time of fracture) -Discussed with ortho, Charma Igo

## 2022-01-02 NOTE — ED Notes (Signed)
IV, blood draw, VS, bedpan for urine sample, covid swab. Pt resting in stretcher, call light in reach.

## 2022-01-02 NOTE — Assessment & Plan Note (Signed)
-  Continue Lipitor °

## 2022-01-02 NOTE — Assessment & Plan Note (Signed)
-  Will check A1c -She does not appear to be taking home medications -Cover with moderate-scale SSI

## 2022-01-02 NOTE — Assessment & Plan Note (Signed)
-  I have discussed code status with the patient and she would not desire resuscitation and would prefer to die a natural death should that situation arise. ?-She will need a gold out of facility DNR form at the time of discharge ?

## 2022-01-02 NOTE — ED Triage Notes (Signed)
BIB [facility] from home, seen in ED for same 2/4 with R wrist fracture in splint and bruising to L eye on arrival. Today pt c/o 10/10 R hip pain on movement. Pt states she did hit her head. No LOC, no thinners. Pt states she did not become weak or dizzy, did not trip, she "just fell." A&O on arrival, hip pain continues. Otherwise VSS.

## 2022-01-02 NOTE — Evaluation (Signed)
Physical Therapy Evaluation Patient Details Name: ADASIA LEININGER MRN: [id number] DOB: 1941-08-23 Today's Date: 01/02/2022  History of Present Illness  81 yo female with recent falls sustained another and was brought to hosp 2/10, with recent onset R side weakness and recent L radial fracture.  Pt is non operative and NWB on R forearm with sugar tong splint.  CT of head is negative, possibly has UTI and has AKI.  PMHx:  CKD, HTN, HLD, DM, falls, L wrist fracture, tobacco dependence  Clinical Impression  Pt was seen for mobility on side of bed, with the bed height being too high for standing on the floor.  Made use of the footstool with a bar to stand, and pt could steady with PT supporting under L shoulder in axilla.  Unsafe to try to walk until she is in a hosp bed setup, but despite this recommend her to rehab stay due to her lack of balance, LE strength loss, low standing endurance and limited awareness of how to move LUE and avoid stressing the fracture.  Acute PT goals are outlined in eval below.       Recommendations for follow up therapy are one component of a multi-disciplinary discharge planning process, led by the attending physician.  Recommendations may be updated based on patient status, additional functional criteria and insurance authorization.  Follow Up Recommendations Skilled nursing-short term rehab (<3 hours/day)    Assistance Recommended at Discharge Frequent or constant Supervision/Assistance  Patient can return home with the following  A lot of help with walking and/or transfers;A lot of help with bathing/dressing/bathroom;Assistance with cooking/housework;Help with stairs or ramp for entrance;Assist for transportation    Equipment Recommendations None recommended by PT  Recommendations for Other Services       Functional Status Assessment Patient has had a recent decline in their functional status and demonstrates the ability to make significant improvements in  function in a reasonable and predictable amount of time.     Precautions / Restrictions Precautions Precautions: Fall Precaution Comments: complete NWB on L wrist and forearm for now Required Braces or Orthoses: Other Brace (sugar tong splint on L forearm) Restrictions Weight Bearing Restrictions: Yes LUE Weight Bearing: Non weight bearing Other Position/Activity Restrictions: NWB on forearm      Mobility  Bed Mobility Overal bed mobility: Needs Assistance Bed Mobility: Supine to Sit, Sit to Supine     Supine to sit: Mod assist Sit to supine: Mod assist   General bed mobility comments: pt requires most help due to trunk struggle to move and difficulty with posterior postural lean both standing and sitting from supine, sidelying    Transfers Overall transfer level: Needs assistance Equipment used: 1 person hand held assist (RUE grip on footstool) Transfers: Sit to/from Stand Sit to Stand: Mod assist, From elevated surface           General transfer comment: mod assist to control her standing balance and min to maintain sitting control    Ambulation/Gait               General Gait Details: unable to attempt  Stairs            Wheelchair Mobility    Modified Rankin (Stroke Patients Only)       Balance Overall balance assessment: Needs assistance, History of Falls Sitting-balance support: Feet supported, Single extremity supported Sitting balance-Leahy Scale: Fair     Standing balance support: Single extremity supported, During functional activity, Bilateral upper extremity supported Standing balance-Leahy  Scale: Poor                               Pertinent Vitals/Pain Pain Assessment Pain Assessment: Faces Faces Pain Scale: Hurts little more Pain Location: L arm in splint Pain Descriptors / Indicators: Sore Pain Intervention(s): Repositioned, Monitored during session    Home Living Family/patient expects to be discharged to::  Private residence Living Arrangements: Alone Available Help at Discharge: Family;Available PRN/intermittently Type of Home: House Home Access: Stairs to enter Entrance Stairs-Rails: Psychiatric nurse of Steps: 4+1     Home Equipment: Rollator (4 wheels) Additional Comments: Pt is able to answer some questions about home, no previous information about her PLOF on chart    Prior Function Prior Level of Function : Patient poor historian/Family not available             Mobility Comments: used RW to support balance but could not tell PT how she managed LUE       Hand Dominance   Dominant Hand: Right    Extremity/Trunk Assessment   Upper Extremity Assessment Upper Extremity Assessment: RUE deficits/detail;LUE deficits/detail RUE Deficits / Details: weaker UE with low grip control LUE Deficits / Details: hand and forearm are in splint    Lower Extremity Assessment Lower Extremity Assessment: RLE deficits/detail;LLE deficits/detail RLE Deficits / Details: 4/5 strength generally LLE Deficits / Details: 4+ LE strength generally    Cervical / Trunk Assessment Cervical / Trunk Assessment: Kyphotic  Communication   Communication: HOH  Cognition Arousal/Alertness: Awake/alert Behavior During Therapy: WFL for tasks assessed/performed Overall Cognitive Status: No family/caregiver present to determine baseline cognitive functioning                                 General Comments: pt cannot answer all questions about home, for example cannot describe how she used the walker        General Comments General comments (skin integrity, edema, etc.): pt is supported above the injury on LUE, and RUE holds foot stool bar with standing support of min to mod assist, unable to steady to walk    Exercises     Assessment/Plan    PT Assessment Patient needs continued PT services  PT Problem List Decreased strength;Decreased range of motion;Decreased  activity tolerance;Decreased balance;Decreased mobility;Decreased coordination;Decreased knowledge of use of DME;Decreased safety awareness;Decreased knowledge of precautions;Pain;Decreased skin integrity       PT Treatment Interventions DME instruction;Gait training;Functional mobility training;Therapeutic activities;Therapeutic exercise;Balance training;Neuromuscular re-education;Patient/family education    PT Goals (Current goals can be found in the Care Plan section)  Acute Rehab PT Goals Patient Stated Goal: to get better, no falls PT Goal Formulation: With patient Time For Goal Achievement: 01/16/22 Potential to Achieve Goals: Good    Frequency Min 2X/week     Co-evaluation               AM-PAC PT "6 Clicks" Mobility  Outcome Measure Help needed turning from your back to your side while in a flat bed without using bedrails?: A Lot Help needed moving from lying on your back to sitting on the side of a flat bed without using bedrails?: A Lot Help needed moving to and from a bed to a chair (including a wheelchair)?: A Lot Help needed standing up from a chair using your arms (e.g., wheelchair or bedside chair)?: A Lot Help needed to walk  in hospital room?: Total Help needed climbing 3-5 steps with a railing? : Total 6 Click Score: 10    End of Session   Activity Tolerance: Patient limited by fatigue;Patient limited by pain;Treatment limited secondary to medical complications (Comment) Patient left: in bed;with call bell/phone within reach Nurse Communication: Mobility status PT Visit Diagnosis: Unsteadiness on feet (R26.81);Muscle weakness (generalized) (M62.81);Pain;Difficulty in walking, not elsewhere classified (R26.2) Pain - Right/Left: Left Pain - part of body: Arm;Hand    Time: [id number] PT Time Calculation (min) (ACUTE ONLY): 35 min   Charges:   PT Evaluation $PT Eval Moderate Complexity: 1 Mod PT Treatments $Therapeutic Activity: 8-22 mins       Ramond Dial 01/02/2022, 4:04 PM  Mee Hives, PT PhD Acute Rehab Dept. Number: [place]

## 2022-01-02 NOTE — Assessment & Plan Note (Signed)
-  Baseline stage 3b CKD -Currently with AKI (see below) -Will follow -Attempt to avoid nephrotoxic medications

## 2022-01-02 NOTE — Assessment & Plan Note (Signed)
-  Hold Hyzaar -Continue Norvasc, atenolol

## 2022-01-02 NOTE — ED Provider Notes (Signed)
[facility] EMERGENCY DEPARTMENT Provider Note   CSN: [id number] Arrival date & time: 01/02/22  6734     History  Chief Complaint  Patient presents with   Kristin Berry is a 81 y.o. female.  81 year old female with prior medical history as detailed below presents for evaluation.  Patient arrives by EMS.  Patient reports a fall today.  She resides at home by herself.  She reports that she lost her balance and fell.  She complains of mild pain to the right hip after the fall this morning.  Her left wrist is in a splint.  This is after being diagnosed with a fracture on February 4.  She was seen at the [facility] facility at that time for the fall then.  She is scheduled to see Dr. Melvyn Novas of [facility] on February 24.    The history is provided by the patient and medical records.  Fall This is a recurrent problem. The current episode started 1 to 2 hours ago. The problem occurs every several days. The problem has not changed since onset.Pertinent negatives include no chest pain and no abdominal pain. Nothing aggravates the symptoms. Nothing relieves the symptoms.      Home Medications Prior to Admission medications   Not on File      Allergies    Patient has no allergy information on record.    Review of Systems   Review of Systems  Cardiovascular:  Negative for chest pain.  Gastrointestinal:  Negative for abdominal pain.  All other systems reviewed and are negative.  Physical Exam Updated Vital Signs BP (!) 133/45    Pulse 65    Temp 97.7 F (36.5 C) (Oral)    Resp 18    LMP  (LMP Unknown)    SpO2 100%  Physical Exam Vitals and nursing note reviewed.  Constitutional:      General: She is not in acute distress.    Appearance: Normal appearance. She is well-developed.  HENT:     Head: Normocephalic and atraumatic.  Eyes:     Conjunctiva/sclera: Conjunctivae normal.     Pupils: Pupils are equal, round, and reactive to light.  Cardiovascular:      Rate and Rhythm: Normal rate and regular rhythm.     Heart sounds: Normal heart sounds.  Pulmonary:     Effort: Pulmonary effort is normal. No respiratory distress.     Breath sounds: Normal breath sounds.  Abdominal:     General: There is no distension.     Palpations: Abdomen is soft.     Tenderness: There is no abdominal tenderness.  Musculoskeletal:        General: Tenderness present. No deformity. Normal range of motion.     Cervical back: Normal range of motion and neck supple.     Comments: Mild tenderness along the lateral aspect of the right hip.  Skin:    General: Skin is warm and dry.  Neurological:     General: No focal deficit present.     Mental Status: She is alert and oriented to person, place, and time.    ED Results / Procedures / Treatments   Labs (all labs ordered are listed, but only abnormal results are displayed) Labs Reviewed  URINALYSIS, ROUTINE W REFLEX MICROSCOPIC - Abnormal; Notable for the following components:      Result Value   APPearance CLOUDY (*)    Hgb urine dipstick MODERATE (*)    Protein, ur 100 (*)  Leukocytes,Ua TRACE (*)    Bacteria, UA MANY (*)    All other components within normal limits  CBC WITH DIFFERENTIAL/PLATELET - Abnormal; Notable for the following components:   WBC 11.4 (*)    RBC 3.05 (*)    Hemoglobin 9.5 (*)    HCT 28.5 (*)    Neutro Abs 9.2 (*)    Monocytes Absolute 1.3 (*)    All other components within normal limits  COMPREHENSIVE METABOLIC PANEL - Abnormal; Notable for the following components:   CO2 14 (*)    Glucose, Bld 192 (*)    BUN 68 (*)    Creatinine, Ser 2.82 (*)    Total Protein 5.8 (*)    Albumin 3.2 (*)    AST 14 (*)    GFR, Estimated 16 (*)    All other components within normal limits  RESP PANEL BY RT-PCR (FLU A&B, COVID) [id number]  URINE CULTURE  PROTIME-INR    EKG EKG Interpretation  Date/Time:  Friday January 02 2022 07:25:01 EST Ventricular Rate:  62 PR Interval:  164 QRS  Duration: 91 QT Interval:  402 QTC Calculation: 409 R Axis:   48 Text Interpretation: Sinus rhythm Confirmed by Kristine Royal [phone]) on 01/02/2022 7:31:35 AM  Radiology DG Chest 1 View  Result Date: 01/02/2022 CLINICAL DATA:  81 year old female with history of pain in the lateral aspect of the right hip. EXAM: CHEST  1 VIEW COMPARISON:  No priors. FINDINGS: Lung volumes are normal. No consolidative airspace disease. No pleural effusions. No pneumothorax. No pulmonary nodule or mass noted. Pulmonary vasculature and the cardiomediastinal silhouette are within normal limits. Atherosclerosis in the thoracic aorta. IMPRESSION: 1.  No radiographic evidence of acute cardiopulmonary disease. 2. Aortic atherosclerosis. Electronically Signed   By: Trudie Reed M.D.   On: 01/02/2022 07:54   CT Head Wo Contrast  Result Date: 01/02/2022 CLINICAL DATA:  Head trauma, minor (Age >= 65y); Neck trauma (Age >= 65y) EXAM: CT HEAD WITHOUT CONTRAST CT CERVICAL SPINE WITHOUT CONTRAST TECHNIQUE: Multidetector CT imaging of the head and cervical spine was performed following the standard protocol without intravenous contrast. Multiplanar CT image reconstructions of the cervical spine were also generated. RADIATION DOSE REDUCTION: This exam was performed according to the departmental dose-optimization program which includes automated exposure control, adjustment of the mA and/or kV according to patient size and/or use of iterative reconstruction technique. COMPARISON:  CT head February 4, 23. FINDINGS: CT HEAD FINDINGS Brain: No evidence of acute infarction, hemorrhage, hydrocephalus, extra-axial collection or mass lesion/mass effect. Patchy white matter hypoattenuation, nonspecific but compatible with chronic microvascular ischemic disease. Mild for age atrophy. Partially empty and expanded sella. Vascular: Calcific atherosclerosis. Skull: Left periorbital contusion.  No visible fracture. Sinuses/Orbits: Mild paranasal  sinus mucosal thickening. Left maxillary sinus retention cyst. Other: No mastoid effusions. CT CERVICAL SPINE FINDINGS Alignment: No substantial sagittal subluxation. Skull base and vertebrae: Vertebral body heights are maintained. No evidence of acute fracture. Soft tissues and spinal canal: No prevertebral fluid or swelling. No visible canal hematoma. Disc levels: Multilevel degenerative disc disease, greatest at C5-C6 and C6-C7 with disc height loss, endplate sclerosis and posterior disc/osteophyte complexes. Multilevel facet and uncovertebral hypertrophy with resulting varying degrees of neural foraminal stenosis. Upper chest: Biapical pleuroparenchymal scarring.  Emphysema. Other: Partially imaged right posterior thyroid nodule, measuring 1.7 cm. Bulky calcific atherosclerosis at bilateral carotid bifurcations. IMPRESSION: CT head: 1. No evidence of acute intracranial abnormality. 2. Chronic microvascular disease. CT cervical spine: 1. No evidence of acute fracture  or traumatic malalignment. 2. Moderate multilevel degenerative change. 3. Bulky calcific atherosclerosis at bilateral carotid bifurcations. Carotid ultrasound or CTA neck could evaluate for potentially significant stenosis if clinically indicated. 4. Partially imaged right posterior thyroid nodule measuring 1.7 cm. Recommend thyroid US (ref: J Am Coll Radiol. 2015 Feb;12(2): 143-50). Electronically Signed   By: Feliberto Harts M.D.   On: 01/02/2022 08:56   CT Cervical Spine Wo Contrast  Result Date: 01/02/2022 CLINICAL DATA:  Head trauma, minor (Age >= 65y); Neck trauma (Age >= 65y) EXAM: CT HEAD WITHOUT CONTRAST CT CERVICAL SPINE WITHOUT CONTRAST TECHNIQUE: Multidetector CT imaging of the head and cervical spine was performed following the standard protocol without intravenous contrast. Multiplanar CT image reconstructions of the cervical spine were also generated. RADIATION DOSE REDUCTION: This exam was performed according to the departmental  dose-optimization program which includes automated exposure control, adjustment of the mA and/or kV according to patient size and/or use of iterative reconstruction technique. COMPARISON:  CT head February 4, 23. FINDINGS: CT HEAD FINDINGS Brain: No evidence of acute infarction, hemorrhage, hydrocephalus, extra-axial collection or mass lesion/mass effect. Patchy white matter hypoattenuation, nonspecific but compatible with chronic microvascular ischemic disease. Mild for age atrophy. Partially empty and expanded sella. Vascular: Calcific atherosclerosis. Skull: Left periorbital contusion.  No visible fracture. Sinuses/Orbits: Mild paranasal sinus mucosal thickening. Left maxillary sinus retention cyst. Other: No mastoid effusions. CT CERVICAL SPINE FINDINGS Alignment: No substantial sagittal subluxation. Skull base and vertebrae: Vertebral body heights are maintained. No evidence of acute fracture. Soft tissues and spinal canal: No prevertebral fluid or swelling. No visible canal hematoma. Disc levels: Multilevel degenerative disc disease, greatest at C5-C6 and C6-C7 with disc height loss, endplate sclerosis and posterior disc/osteophyte complexes. Multilevel facet and uncovertebral hypertrophy with resulting varying degrees of neural foraminal stenosis. Upper chest: Biapical pleuroparenchymal scarring.  Emphysema. Other: Partially imaged right posterior thyroid nodule, measuring 1.7 cm. Bulky calcific atherosclerosis at bilateral carotid bifurcations. IMPRESSION: CT head: 1. No evidence of acute intracranial abnormality. 2. Chronic microvascular disease. CT cervical spine: 1. No evidence of acute fracture or traumatic malalignment. 2. Moderate multilevel degenerative change. 3. Bulky calcific atherosclerosis at bilateral carotid bifurcations. Carotid ultrasound or CTA neck could evaluate for potentially significant stenosis if clinically indicated. 4. Partially imaged right posterior thyroid nodule measuring 1.7  cm. Recommend thyroid US (ref: J Am Coll Radiol. 2015 Feb;12(2): 143-50). Electronically Signed   By: Feliberto Harts M.D.   On: 01/02/2022 08:56   DG Hip Unilat W or Wo Pelvis 2-3 Views Right  Result Date: 01/02/2022 CLINICAL DATA:  Right hip pain after fall. EXAM: DG HIP (WITH OR WITHOUT PELVIS) 2-3V RIGHT COMPARISON:  None. FINDINGS: No acute fracture or dislocation. 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 Mild-to-moderate left hip joint space narrowing. The right hip joint space is preserved. The pubic symphysis and sacroiliac joints are intact. Soft tissues are unremarkable. IMPRESSION: 1. No acute osseous abnormality. 2. 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 Electronically Signed   By: Obie Dredge M.D.   On: 01/02/2022 07:58    Procedures Procedures    Medications Ordered in ED Medications  cefTRIAXone (ROCEPHIN) 1 g in sodium chloride 0.9 % 100 mL IVPB (1 g Intravenous New Bag/Given 01/02/22 0848)  0.9 %  sodium chloride infusion ( Intravenous New Bag/Given 01/02/22 0901)    ED Course/ Medical Decision Making/ A&P  Medical Decision Making Amount and/or Complexity of Data Reviewed Labs: ordered. Radiology: ordered.  Risk Prescription drug management.    Medical Screen Complete  This patient presented to the ED with complaint of fall, loss of balance, right hip pain.  This complaint involves an extensive number of treatment options. The initial differential diagnosis includes, but is not limited to, traumatic injury related to fall, metabolic abnormality, infection, etc.  This presentation is: Acute, Chronic, Self-Limited, Previously Undiagnosed, Uncertain Prognosis, Complicated, Systemic Symptoms, and Threat to Life/Bodily Function  Patient presents by EMS after recurrent fall.  Patient  complained of mild pain to the right hip.  Patient with recent fall on February 4 with fracture of the left wrist.  Patient resides at home.  Work-up demonstrates no evidence of acute fracture.  However, patient's creatinine today is elevated to 2.8.  Baseline creatinine appears to be at 1.3 from prior labs obtained with [facility] in September 2022.  Patient's UA is also concerning for possible UTI.  Patient would likely benefit from admission for further work-up and treatment.  Hospitalist service is aware of case and will evaluate for admission.  Co morbidities that complicated the patient's evaluation  Advanced age   Additional history obtained:  External records from outside sources obtained and reviewed including prior ED visits and prior Inpatient records.    Lab Tests:  I ordered and personally interpreted labs.  The pertinent results include: CBC, CMP, COVID, flu, UA   Imaging Studies ordered:  I ordered imaging studies including CT head, CT C-spine, chest x-ray, pelvis x-ray, right hip x-ray I independently visualized and interpreted obtained imaging which showed NAD I agree with the radiologist interpretation.   Cardiac Monitoring:  The patient was maintained on a cardiac monitor.  I personally viewed and interpreted the cardiac monitor which showed an underlying rhythm of: NSR   Medicines ordered:  I ordered medication including IV fluids, Rocephin for dehydration, UTI Reevaluation of the patient after these medicines showed that the patient: improved    Problem List / ED Course:  Recurrent fall, AKI, possible UTI   Reevaluation:  After the interventions noted above, I reevaluated the patient and found that they have: improved   Disposition:  After consideration of the diagnostic results and the patients response to treatment, I feel that the patent would benefit from admission.          Final Clinical Impression(s) / ED  Diagnoses Final diagnoses:  Fall, initial encounter  AKI (acute kidney injury) [facility](HCC)    Rx / DC Orders ED Discharge Orders     None         Wynetta FinesMessick, Jolanda Mccann C, MD 01/02/22 [phone]

## 2022-01-03 DIAGNOSIS — N179 Acute kidney failure, unspecified: Secondary | ICD-10-CM | POA: Diagnosis not present

## 2022-01-03 LAB — BASIC METABOLIC PANEL
Anion gap: 9 (ref 5–15)
BUN: 61 mg/dL — ABNORMAL HIGH (ref 8–23)
CO2: 16 mmol/L — ABNORMAL LOW (ref 22–32)
Calcium: 9.9 mg/dL (ref 8.9–10.3)
Chloride: 112 mmol/L — ABNORMAL HIGH (ref 98–111)
Creatinine, Ser: 2.5 mg/dL — ABNORMAL HIGH (ref 0.44–1.00)
GFR, Estimated: 19 mL/min — ABNORMAL LOW (ref >60)
Glucose, Bld: 97 mg/dL (ref 70–99)
Potassium: 3.3 mmol/L — ABNORMAL LOW (ref 3.5–5.1)
Sodium: 137 mmol/L (ref 135–145)

## 2022-01-03 LAB — URINE CULTURE

## 2022-01-03 LAB — CBC
HCT: 27.3 % — ABNORMAL LOW (ref 36.0–46.0)
Hemoglobin: 8.8 g/dL — ABNORMAL LOW (ref 12.0–15.0)
MCH: 29.9 pg (ref 26.0–34.0)
MCHC: 32.2 g/dL (ref 30.0–36.0)
MCV: 92.9 fL (ref 80.0–100.0)
Platelets: 156 10*3/uL (ref 150–400)
RBC: 2.94 MIL/uL — ABNORMAL LOW (ref 3.87–5.11)
RDW: 12.1 % (ref 11.5–15.5)
WBC: 9.1 10*3/uL (ref 4.0–10.5)
nRBC: 0 % (ref 0.0–0.2)

## 2022-01-03 LAB — GLUCOSE, CAPILLARY
Glucose-Capillary: 209 mg/dL — ABNORMAL HIGH (ref 70–99)
Glucose-Capillary: 135 mg/dL — ABNORMAL HIGH (ref 70–99)
Glucose-Capillary: 64 mg/dL — ABNORMAL LOW (ref 70–99)
Glucose-Capillary: 143 mg/dL — ABNORMAL HIGH (ref 70–99)
Glucose-Capillary: 193 mg/dL — ABNORMAL HIGH (ref 70–99)

## 2022-01-03 MED ORDER — GLUCOSE 4 G PO CHEW
CHEWABLE_TABLET | ORAL | Status: AC
  Filled 2022-01-03: qty 1

## 2022-01-03 NOTE — Evaluation (Signed)
Occupational Therapy Evaluation Patient Details Name: Kristin Berry MRN: [id number] DOB: Dec 11, 1940 Today's Date: 01/03/2022   History of Present Illness 81 yo female with recent falls sustained another and was brought to hosp 2/10, with recent onset R side weakness and recent L radial fracture.  Pt is non operative and NWB on R forearm with sugar tong splint.  CT of head is negative, possibly has UTI and has AKI.  PMHx:  CKD, HTN, HLD, DM, falls, L wrist fracture, tobacco dependence   Clinical Impression   Patient admitted for the above diagnosis following a mechanical fall.  Patient does not remember the events surrounding the fall, but understands her injuries.  Non weight bearing to LUE reinforced.  PTA she lived alone with assist and check-ins from family, primarily a grandson.  She has had multiple falls at home, and currently is needing up to Mod a for basic transfers and lower body ADL.  OT will follow in the acute setting with SNF recommended for post acute rehab.  The patient is wanting to return home, but unless family can provide 24 hour Moderate Assist,  SNF for short term rehab is recommended.         Recommendations for follow up therapy are one component of a multi-disciplinary discharge planning process, led by the attending physician.  Recommendations may be updated based on patient status, additional functional criteria and insurance authorization.   Follow Up Recommendations  Skilled nursing-short term rehab (<3 hours/day)    Assistance Recommended at Discharge Frequent or constant Supervision/Assistance  Patient can return home with the following A lot of help with walking and/or transfers;A lot of help with bathing/dressing/bathroom;Assist for transportation;Assistance with cooking/housework;Direct supervision/assist for financial management    Functional Status Assessment  Patient has had a recent decline in their functional status and demonstrates the ability to make  significant improvements in function in a reasonable and predictable amount of time.  Equipment Recommendations  Tub/shower seat;BSC/3in1    Recommendations for Other Services       Precautions / Restrictions Precautions Precautions: Fall Required Braces or Orthoses: Other Brace Other Brace: sugar-tong cast Restrictions Weight Bearing Restrictions: Yes LUE Weight Bearing: Non weight bearing Other Position/Activity Restrictions: NWB on forearm      Mobility Bed Mobility Overal bed mobility: Needs Assistance Bed Mobility: Supine to Sit, Sit to Supine   Sidelying to sit: Min assist Supine to sit: Min assist          Transfers Overall transfer level: Needs assistance Equipment used: 1 person hand held assist Transfers: Bed to chair/wheelchair/BSC       Step pivot transfers: Mod assist            Balance Overall balance assessment: Needs assistance, History of Falls Sitting-balance support: Feet supported, Single extremity supported Sitting balance-Leahy Scale: Fair     Standing balance support: Single extremity supported, During functional activity, Bilateral upper extremity supported Standing balance-Leahy Scale: Poor                             ADL either performed or assessed with clinical judgement   ADL       Grooming: Wash/dry hands;Wash/dry face;Set up;Sitting           Upper Body Dressing : Minimal assistance;Sitting   Lower Body Dressing: Moderate assistance;Sit to/from stand   Toilet Transfer: Minimal assistance Toilet Transfer Details (indicate cue type and reason): simulated recliner to bed - patient deferring  to stay OOB at this time.                 Vision Baseline Vision/History: 1 Wears glasses Patient Visual Report: Blurring of vision Additional Comments: L eye is swollen     Perception Perception Perception: Within Functional Limits   Praxis Praxis Praxis: Intact    Pertinent Vitals/Pain Pain  Assessment Pain Assessment: Faces Faces Pain Scale: Hurts a little bit Pain Location: L arm in splint Pain Descriptors / Indicators: Sore Pain Intervention(s): Monitored during session     Hand Dominance Right   Extremity/Trunk Assessment Upper Extremity Assessment Upper Extremity Assessment: LUE deficits/detail LUE Deficits / Details: hand and forearm are in splint LUE: Unable to fully assess due to immobilization LUE Sensation: WNL LUE Coordination: WNL   Lower Extremity Assessment Lower Extremity Assessment: Defer to PT evaluation   Cervical / Trunk Assessment Cervical / Trunk Assessment: Kyphotic   Communication Communication Communication: HOH   Cognition Arousal/Alertness: Awake/alert Behavior During Therapy: WFL for tasks assessed/performed Overall Cognitive Status: Impaired/Different from baseline Area of Impairment: Orientation, Safety/judgement, Awareness, Problem solving, Following commands                 Orientation Level: Person, Place, Time (Patient cannot recall events of fall, but understands her injuries)     Following Commands: Follows one step commands consistently Safety/Judgement: Decreased awareness of safety, Decreased awareness of deficits   Problem Solving: Requires verbal cues General Comments: Decreased awareness of deficts, and decreased safety noted.     General Comments       Exercises     Shoulder Instructions      Home Living Family/patient expects to be discharged to:: Private residence Living Arrangements: Alone Available Help at Discharge: Family;Available PRN/intermittently Type of Home: House Home Access: Stairs to enter [employer] of Steps: 4+1 Entrance Stairs-Rails: Right;Left Home Layout: One level     Bathroom Shower/Tub: Chief Strategy Officer: Standard Bathroom Accessibility: Yes How Accessible: Accessible via walker Home Equipment: Rollator (4 wheels)          Prior  Functioning/Environment               Mobility Comments: used RW for mobility ADLs Comments: Patient stating Mod I with occasional showers, and occasion sink bath.  Grandson assists with groceries and community mobility.  Patient states she manages her medications.        OT Problem List: Decreased activity tolerance;Impaired balance (sitting and/or standing);Decreased safety awareness;Pain;Impaired UE functional use      OT Treatment/Interventions: Self-care/ADL training;Therapeutic exercise;Balance training;Therapeutic activities;Patient/family education    OT Goals(Current goals can be found in the care plan section) Acute Rehab OT Goals Patient Stated Goal: I'm going home, my Lucila Maine can check in on me OT Goal Formulation: With patient Time For Goal Achievement: 01/17/22 Potential to Achieve Goals: Good  OT Frequency: Min 2X/week    Co-evaluation              AM-PAC OT "6 Clicks" Daily Activity     Outcome Measure Help from another person eating meals?: A Little Help from another person taking care of personal grooming?: A Little Help from another person toileting, which includes using toliet, bedpan, or urinal?: A Lot Help from another person bathing (including washing, rinsing, drying)?: A Lot Help from another person to put on and taking off regular upper body clothing?: A Little Help from another person to put on and taking off regular lower body clothing?: A Lot 6  Click Score: 15   End of Session Equipment Utilized During Treatment: Gait belt Nurse Communication: Mobility status  Activity Tolerance: Patient tolerated treatment well Patient left: in bed;with call bell/phone within reach;with bed alarm set  OT Visit Diagnosis: Unsteadiness on feet (R26.81);Muscle weakness (generalized) (M62.81);Pain Pain - Right/Left: Left Pain - part of body: Arm;Hand                Time: 1600-1620 OT Time Calculation (min): 20 min Charges:  OT General Charges $OT  Visit: 1 Visit OT Evaluation $OT Eval Moderate Complexity: 1 Mod  01/03/2022  RP, OTR/L  Acute Rehabilitation Services  Office:  [phone]   Suzanna Obey 01/03/2022, 3:13 PM

## 2022-01-03 NOTE — Progress Notes (Signed)
PROGRESS NOTE    Kristin Berry  [id number] DOB: 03-18-1941 DOA: 01/02/2022 PCP: Shirline Frees, MD   Brief Narrative:  Kristin Berry is a 81 y.o. female with medical history significant of DM; stage 3b CKD; HTN; and HLD presenting with recurrent falls - worsening over the past 2 months. Has started using a walker recently but this was not her previous baseline. [place] most recently on 2/3 with wrist fracture.  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  AKI on CKD3b present on admission, improving -Likely secondary to poor PO intake -Increase PO intake, continue IVF    Thyroid nodule- (present on admission) Lab Results  Component Value Date   TSH 0.293 (L) 01/02/2022  -[place] outpatient per PCP   Questionable UTI (urinary tract infection) - (present on admission) -Culture is pending -Continue ceftriaxone x3 days    Anemia, likely chronic anemia of chronic disease -Secondary to history of CKD  Tobacco/nicotine dependence- (present on admission) -Encourage cessation.   -Patch ordered   Hypertension - (present on admission) -Hold Hyzaar in the setting of  AKI -Continue Norvasc, atenolol  Dyslipidemia- (present on admission) -Continue Lipitor   Non insulin dependent diabetes mellitus type 2 in nonobese [facility]) Lab Results  Component Value Date   HGBA1C 6.5 (H) 01/02/2022  - Borderline but controlled - Cover with moderate-scale SSI   DVT prophylaxis: Lovenox Code Status: DNR Family Communication: None present  Status is: Observation  Dispo: The patient is from: Home              Anticipated d/c is to: To be determined              Anticipated d/c date is: 48 to 72 hours              Patient currently not medically stable for discharge  Consultants:  None  Procedures:  None  Antimicrobials:  Ceftriaxone x3 days  Subjective: No acute issues or events overnight  Objective: Vitals:   01/02/22 1621 01/02/22 2110 01/03/22 0119 01/03/22 0521  BP: (!) 134/44 (!) 122/57 (!) 128/50 (!) 126/45  Pulse: 68 65 65 60  Resp: 18 17 17 17   Temp: 98.7 F (37.1 C) 98.3 F (36.8 C) 98.5 F (36.9 C) 97.8 F (36.6 C)  TempSrc: Oral Oral Oral Oral  SpO2: 100% 100% 98% 100%  Weight: 47.1 kg     Height: 5' (1.524 m)       Intake/Output Summary (Last 24 hours) at 01/03/2022 0731 Last data filed at 01/02/2022 2016 Gross per 24 hour  Intake 1112.3 ml  Output 1 ml  Net 1111.3 ml   Filed Weights   01/02/22 1621  Weight: 47.1 kg  Examination:  General exam: Appears calm and comfortable  Respiratory system: Clear to auscultation. Respiratory effort normal. Cardiovascular system: S1 & S2 heard, RRR. No JVD, murmurs, rubs, gallops or clicks. No pedal edema. Gastrointestinal system: Abdomen is nondistended, soft and nontender. No organomegaly or masses felt. Normal bowel sounds heard. Central nervous system: Alert and oriented. No focal neurological deficits. Extremities: Symmetric 5 x 5 power. Skin: No rashes, lesions or ulcers Psychiatry: Judgement and insight appear normal. Mood & affect appropriate.   Data Reviewed: I have  personally reviewed following labs and imaging studies  CBC: Recent Labs  Lab 01/02/22 0810 01/03/22 0122  WBC 11.4* 9.1  NEUTROABS 9.2*  --   HGB 9.5* 8.8*  HCT 28.5* 27.3*  MCV 93.4 92.9  PLT 165 [id number]   Basic Metabolic Panel: Recent Labs  Lab 01/02/22 0810 01/03/22 0122  NA 137 137  K 3.9 3.3*  CL 109 112*  CO2 14* 16*  GLUCOSE 192* 97  BUN 68* 61*  CREATININE 2.82* 2.50*  CALCIUM 9.8 9.9   GFR: Estimated Creatinine Clearance: 12.9 mL/min (A) (by C-G formula based on SCr of 2.5 mg/dL (H)). Liver Function Tests: Recent Labs  Lab 01/02/22 0810  AST 14*  ALT 13  ALKPHOS 101  BILITOT 0.5  PROT 5.8*  ALBUMIN 3.2*   No results for input(s): LIPASE, AMYLASE in the last 168 hours. No results for input(s): AMMONIA in the last 168 hours. Coagulation Profile: Recent Labs  Lab 01/02/22 0810  INR 0.9   Cardiac Enzymes: No results for input(s): CKTOTAL, CKMB, CKMBINDEX, TROPONINI in the last 168 hours. BNP (last 3 results) No results for input(s): PROBNP in the last 8760 hours. HbA1C: Recent Labs    01/02/22 1653  HGBA1C 6.5*   CBG: Recent Labs  Lab 01/02/22 1136 01/02/22 1620 01/02/22 2126  GLUCAP 167* 101* 95   Lipid Profile: No results for input(s): CHOL, HDL, LDLCALC, TRIG, CHOLHDL, LDLDIRECT in the last 72 hours. Thyroid Function Tests: Recent Labs    01/02/22 1653  TSH 0.293*   Anemia Panel: No results for input(s): VITAMINB12, FOLATE, FERRITIN, TIBC, IRON, RETICCTPCT in the last 72 hours. Sepsis Labs: No results for input(s): PROCALCITON, LATICACIDVEN in the last 168 hours.  Recent Results (from the past 240 hour(s))  Resp Panel by RT-PCR (Flu A&B, Covid) Nasopharyngeal Swab     Status: None   Collection Time: 01/02/22  7:30 AM   Specimen: Nasopharyngeal Swab; Nasopharyngeal(NP) swabs in vial transport medium  Result Value Ref Range Status   SARS Coronavirus 2 by RT PCR NEGATIVE NEGATIVE Final    Comment: (NOTE) SARS-CoV-2 target  nucleic acids are NOT DETECTED.  The SARS-CoV-2 RNA is generally detectable in upper respiratory specimens during the acute phase of infection. The lowest concentration of SARS-CoV-2 viral copies this assay can detect is 138 copies/mL. A negative result does not preclude SARS-Cov-2 infection and should not be used as the sole basis for treatment or other patient management decisions. A negative result may occur with  improper specimen collection/handling, submission of specimen other than nasopharyngeal swab, presence of viral mutation(s) within the areas targeted by this assay, and inadequate number of viral copies(<138 copies/mL). A negative result must be combined with clinical observations, patient history, and epidemiological information. The expected result is Negative.  Fact Sheet for Patients:  EntrepreneurPulse.com.au  Fact Sheet for Healthcare Providers:  IncredibleEmployment.be  This test is no t yet approved or cleared by the [place] and  has been authorized for  detection and/or diagnosis of SARS-CoV-2 by FDA under an Emergency Use Authorization (EUA). This EUA will remain  in effect (meaning this test can be used) for the duration of the COVID-19 declaration under Section 564(b)(1) of the Act, 21 U.S.C.section 360bbb-3(b)(1), unless the authorization is terminated  or revoked sooner.       Influenza A by PCR NEGATIVE NEGATIVE Final   Influenza B by PCR NEGATIVE NEGATIVE Final    Comment: (NOTE) The Xpert Xpress SARS-CoV-2/FLU/RSV plus assay is intended as an aid in the diagnosis of influenza from Nasopharyngeal swab specimens and should not be used as a sole basis for treatment. Nasal washings and aspirates are unacceptable for Xpert Xpress SARS-CoV-2/FLU/RSV testing.  Fact Sheet for Patients: EntrepreneurPulse.com.au  Fact Sheet for Healthcare  Providers: IncredibleEmployment.be  This test is not yet approved or cleared by the [place] FDA and has been authorized for detection and/or diagnosis of SARS-CoV-2 by FDA under an Emergency Use Authorization (EUA). This EUA will remain in effect (meaning this test can be used) for the duration of the COVID-19 declaration under Section 564(b)(1) of the Act, 21 U.S.C. section 360bbb-3(b)(1), unless the authorization is terminated or revoked.  Performed at [facility], [place] [address] [place], [place] [address]   Urine Culture     Status: Abnormal   Collection Time: 01/02/22  8:45 AM   Specimen: Urine, Clean Catch  Result Value Ref Range Status   Specimen Description URINE, CLEAN CATCH  Final   Special Requests   Final    NONE Performed at [facility], [place] [address] [place], [place] [address]    Culture MULTIPLE SPECIES PRESENT, SUGGEST RECOLLECTION (A)  Final   Report Status 01/03/2022 FINAL  Final    Radiology Studies: DG Chest 1 View  Result Date: 01/02/2022 CLINICAL DATA:  81 year old female with history of pain in the lateral aspect of the right hip. EXAM: CHEST  1 VIEW COMPARISON:  No priors. FINDINGS: Lung volumes are normal. No consolidative airspace disease. No pleural effusions. No pneumothorax. No pulmonary nodule or mass noted. Pulmonary vasculature and the cardiomediastinal silhouette are within normal limits. Atherosclerosis in the thoracic aorta. IMPRESSION: 1.  No radiographic evidence of acute cardiopulmonary disease. 2. Aortic atherosclerosis. Electronically Signed   By: Vinnie Langton M.D.   On: 01/02/2022 07:54   CT Head Wo Contrast  Result Date: 01/02/2022 CLINICAL DATA:  Head trauma, minor (Age >= 65y); Neck trauma (Age >= 65y) EXAM: CT HEAD WITHOUT CONTRAST CT CERVICAL SPINE WITHOUT CONTRAST TECHNIQUE: Multidetector CT imaging of the head and cervical spine was performed following the standard protocol without intravenous  contrast. Multiplanar CT image reconstructions of the cervical spine were also generated. RADIATION DOSE REDUCTION: This exam was performed according to the departmental dose-optimization program which includes automated exposure control, adjustment of the mA and/or kV according to patient size and/or use of iterative reconstruction technique. COMPARISON:  CT head February 4, 23. FINDINGS: CT HEAD FINDINGS Brain: No evidence of acute infarction, hemorrhage, hydrocephalus, extra-axial collection or mass lesion/mass effect. Patchy white matter hypoattenuation, nonspecific but compatible with chronic microvascular ischemic disease. Mild for age atrophy. Partially empty and expanded sella. Vascular: Calcific atherosclerosis. Skull: Left periorbital contusion.  No visible fracture. Sinuses/Orbits: Mild paranasal sinus mucosal thickening. Left maxillary sinus retention cyst. Other: No mastoid effusions. CT CERVICAL SPINE FINDINGS Alignment: No substantial sagittal subluxation. Skull base and vertebrae: Vertebral body heights are maintained. No evidence of acute fracture. Soft tissues and spinal canal: No  prevertebral fluid or swelling. No visible canal hematoma. Disc levels: Multilevel degenerative disc disease, greatest at C5-C6 and C6-C7 with disc height loss, endplate sclerosis and posterior disc/osteophyte complexes. Multilevel facet and uncovertebral hypertrophy with resulting varying degrees of neural foraminal stenosis. Upper chest: Biapical pleuroparenchymal scarring.  Emphysema. Other: Partially imaged right posterior thyroid nodule, measuring 1.7 cm. Bulky calcific atherosclerosis at bilateral carotid bifurcations. IMPRESSION: CT head: 1. No evidence of acute intracranial abnormality. 2. Chronic microvascular disease. CT cervical spine: 1. No evidence of acute fracture or traumatic malalignment. 2. Moderate multilevel degenerative change. 3. Bulky calcific atherosclerosis at bilateral carotid bifurcations.  Carotid ultrasound or CTA neck could evaluate for potentially significant stenosis if clinically indicated. 4. Partially imaged right posterior thyroid nodule measuring 1.7 cm. Recommend thyroid US (ref: J Am Coll Radiol. 2015 Feb;12(2): 143-50). Electronically Signed   By: Margaretha Sheffield M.D.   On: 01/02/2022 08:56   CT Cervical Spine Wo Contrast  Result Date: 01/02/2022 CLINICAL DATA:  Head trauma, minor (Age >= 65y); Neck trauma (Age >= 65y) EXAM: CT HEAD WITHOUT CONTRAST CT CERVICAL SPINE WITHOUT CONTRAST TECHNIQUE: Multidetector CT imaging of the head and cervical spine was performed following the standard protocol without intravenous contrast. Multiplanar CT image reconstructions of the cervical spine were also generated. RADIATION DOSE REDUCTION: This exam was performed according to the departmental dose-optimization program which includes automated exposure control, adjustment of the mA and/or kV according to patient size and/or use of iterative reconstruction technique. COMPARISON:  CT head February 4, 23. FINDINGS: CT HEAD FINDINGS Brain: No evidence of acute infarction, hemorrhage, hydrocephalus, extra-axial collection or mass lesion/mass effect. Patchy white matter hypoattenuation, nonspecific but compatible with chronic microvascular ischemic disease. Mild for age atrophy. Partially empty and expanded sella. Vascular: Calcific atherosclerosis. Skull: Left periorbital contusion.  No visible fracture. Sinuses/Orbits: Mild paranasal sinus mucosal thickening. Left maxillary sinus retention cyst. Other: No mastoid effusions. CT CERVICAL SPINE FINDINGS Alignment: No substantial sagittal subluxation. Skull base and vertebrae: Vertebral body heights are maintained. No evidence of acute fracture. Soft tissues and spinal canal: No prevertebral fluid or swelling. No visible canal hematoma. Disc levels: Multilevel degenerative disc disease, greatest at C5-C6 and C6-C7 with disc height loss, endplate  sclerosis and posterior disc/osteophyte complexes. Multilevel facet and uncovertebral hypertrophy with resulting varying degrees of neural foraminal stenosis. Upper chest: Biapical pleuroparenchymal scarring.  Emphysema. Other: Partially imaged right posterior thyroid nodule, measuring 1.7 cm. Bulky calcific atherosclerosis at bilateral carotid bifurcations. IMPRESSION: CT head: 1. No evidence of acute intracranial abnormality. 2. Chronic microvascular disease. CT cervical spine: 1. No evidence of acute fracture or traumatic malalignment. 2. Moderate multilevel degenerative change. 3. Bulky calcific atherosclerosis at bilateral carotid bifurcations. Carotid ultrasound or CTA neck could evaluate for potentially significant stenosis if clinically indicated. 4. Partially imaged right posterior thyroid nodule measuring 1.7 cm. Recommend thyroid US (ref: J Am Coll Radiol. 2015 Feb;12(2): 143-50). Electronically Signed   By: Margaretha Sheffield M.D.   On: 01/02/2022 08:56   DG Hip Unilat W or Wo Pelvis 2-3 Views Right  Result Date: 01/02/2022 CLINICAL DATA:  Right hip pain after fall. EXAM: DG HIP (WITH OR WITHOUT PELVIS) 2-3V RIGHT COMPARISON:  None. FINDINGS: No acute fracture or dislocation. 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 Mild-to-moderate left hip joint space narrowing. The right hip joint space is preserved. The pubic symphysis and sacroiliac joints are intact. Soft tissues are unremarkable. IMPRESSION: 1. No acute osseous abnormality. 2. 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 Electronically Signed   By: Titus Dubin M.D.   On: 01/02/2022 07:58    Scheduled Meds:  amLODipine  10 mg Oral Daily   aspirin  81 mg Oral Daily   atenolol  50 mg Oral BID   atorvastatin  40 mg Oral Daily   docusate sodium  100 mg Oral BID   enoxaparin (LOVENOX) injection   30 mg Subcutaneous Q24H   insulin aspart  0-5 Units Subcutaneous QHS   insulin aspart  0-9 Units Subcutaneous TID WC   nicotine  14 mg Transdermal Daily   Continuous Infusions:  cefTRIAXone (ROCEPHIN)  IV     lactated ringers Stopped (01/02/22 1606)    LOS: 0 days   Time spent: 78min  Anamae Rochelle C Hafiz Irion, DO [facility]  If 7PM-7AM, please contact night-coverage [email or web address]  01/03/2022, 7:31 AM

## 2022-01-03 NOTE — TOC Initial Note (Signed)
Transition of Care [facility]) - Initial/Assessment Note    Patient Details  Name: Kristin Berry MRN: [id number] Date of Birth: [birth year]  Transition of Care [facility]) CM/SW Contact:    Bartholomew Crews, RN Phone Number: [phone] 01/03/2022, 5:38 PM  Clinical Narrative:                  Spoke with patient at bedside to discuss post acute transition. PTA home alone with family occasionally checking in on her. Patient would like to return home with family support. Discussed home health option for PT/OT - agreeable. Offered choice of agency - Referral to [facility] pending. Patient will need HH and Face to Face orders for PT, OT. TOC following for transition needs.   Expected Discharge Plan: [place] Barriers to Discharge: Continued Medical Work up   Patient Goals and CMS Choice Patient states their goals for this hospitalization and ongoing recovery are:: return home with family support CMS Medicare.gov Compare Post Acute Care list provided to:: Patient Choice offered to / list presented to : Patient  Expected Discharge Plan and Services Expected Discharge Plan: [place] In-house Referral: Clinical Social Work Discharge Planning Services: CM Consult Post Acute Care Choice: [place] arrangements for the past 2 months: Apartment                           HH Arranged: PT, OT [place] Agency: [facility] Date [place]: 01/03/22 Time [place]: 1738 Representative spoke with at [place]: [place] Arrangements/Services Living arrangements for the past 2 months: [place] with:: Self Patient language and need for interpreter reviewed:: Yes Do you feel safe going back to the place where you live?: Yes      Need for Family Participation in Patient Care: Yes (Comment) Care giver support system in place?: Yes (comment)   Criminal Activity/Legal Involvement Pertinent to Current Situation/Hospitalization: No  - Comment as needed  Activities of Daily Living Home Assistive Devices/Equipment: Walker (specify type) ADL Screening (condition at time of admission) Patient's cognitive ability adequate to safely complete daily activities?: Yes Is the patient deaf or have difficulty hearing?: No Does the patient have difficulty seeing, even when wearing glasses/contacts?: No Does the patient have difficulty concentrating, remembering, or making decisions?: No Patient able to express need for assistance with ADLs?: Yes Does the patient have difficulty dressing or bathing?: No Independently performs ADLs?: Yes (appropriate for developmental age) Does the patient have difficulty walking or climbing stairs?: Yes Weakness of Legs: Both Weakness of Arms/Hands: Left  Permission Sought/Granted                  Emotional Assessment Appearance:: Appears stated age Attitude/Demeanor/Rapport: Engaged Affect (typically observed): Accepting Orientation: : Oriented to Self, Oriented to Place, Oriented to  Time, Oriented to Situation Alcohol / Substance Use: Not Applicable Psych Involvement: No (comment)  Admission diagnosis:  Fall [W19.XXXA] AKI (acute kidney injury) ([place]) [N17.9] Fall, initial encounter [W19.XXXA] Patient Active Problem List   Diagnosis Date Noted   AKI (acute kidney injury) ([place]) 01/02/2022   Diabetes mellitus type 2 in nonobese ([place]) 01/02/2022   Dyslipidemia 01/02/2022   Hypertension 01/02/2022   Stage 3b chronic kidney disease (CKD) ([place]) 01/02/2022   Tobacco dependence 01/02/2022   Ambulatory dysfunction 01/02/2022   UTI (urinary tract infection) 01/02/2022   Thyroid nodule 01/02/2022   DNR (do not resuscitate) 01/02/2022  Colles' fracture 01/02/2022   PCP:  Shirline Frees, MD Pharmacy:   [facility] DRUG STORE [place], [place] [address] Phone: [phone] Fax:  [phone]     Social Determinants of Health (SDOH) Interventions    Readmission Risk Interventions No flowsheet data found.

## 2022-01-03 NOTE — Care Management Obs Status (Signed)
[place] NOTIFICATION   Patient Details  Name: Kristin Berry MRN: [id number] Date of Birth: Apr 21, 1941   Medicare Observation Status Notification Given:  Yes    Bartholomew Crews, RN 01/03/2022, 5:13 PM

## 2022-01-04 DIAGNOSIS — S52532A Colles' fracture of left radius, initial encounter for closed fracture: Secondary | ICD-10-CM | POA: Diagnosis present

## 2022-01-04 DIAGNOSIS — E785 Hyperlipidemia, unspecified: Secondary | ICD-10-CM | POA: Diagnosis present

## 2022-01-04 DIAGNOSIS — N1832 Chronic kidney disease, stage 3b: Secondary | ICD-10-CM | POA: Diagnosis present

## 2022-01-04 DIAGNOSIS — N390 Urinary tract infection, site not specified: Secondary | ICD-10-CM | POA: Diagnosis present

## 2022-01-04 DIAGNOSIS — R269 Unspecified abnormalities of gait and mobility: Secondary | ICD-10-CM | POA: Diagnosis present

## 2022-01-04 DIAGNOSIS — F17210 Nicotine dependence, cigarettes, uncomplicated: Secondary | ICD-10-CM | POA: Diagnosis present

## 2022-01-04 DIAGNOSIS — Z885 Allergy status to narcotic agent status: Secondary | ICD-10-CM | POA: Diagnosis not present

## 2022-01-04 DIAGNOSIS — Z20822 Contact with and (suspected) exposure to covid-19: Secondary | ICD-10-CM | POA: Diagnosis present

## 2022-01-04 DIAGNOSIS — D631 Anemia in chronic kidney disease: Secondary | ICD-10-CM | POA: Diagnosis present

## 2022-01-04 DIAGNOSIS — Z716 Tobacco abuse counseling: Secondary | ICD-10-CM | POA: Diagnosis not present

## 2022-01-04 DIAGNOSIS — W010XXA Fall on same level from slipping, tripping and stumbling without subsequent striking against object, initial encounter: Secondary | ICD-10-CM | POA: Diagnosis present

## 2022-01-04 DIAGNOSIS — E1122 Type 2 diabetes mellitus with diabetic chronic kidney disease: Secondary | ICD-10-CM | POA: Diagnosis present

## 2022-01-04 DIAGNOSIS — Z66 Do not resuscitate: Secondary | ICD-10-CM | POA: Diagnosis present

## 2022-01-04 DIAGNOSIS — I129 Hypertensive chronic kidney disease with stage 1 through stage 4 chronic kidney disease, or unspecified chronic kidney disease: Secondary | ICD-10-CM | POA: Diagnosis present

## 2022-01-04 DIAGNOSIS — R296 Repeated falls: Secondary | ICD-10-CM | POA: Diagnosis present

## 2022-01-04 DIAGNOSIS — N179 Acute kidney failure, unspecified: Secondary | ICD-10-CM | POA: Diagnosis not present

## 2022-01-04 DIAGNOSIS — E041 Nontoxic single thyroid nodule: Secondary | ICD-10-CM | POA: Diagnosis present

## 2022-01-04 LAB — GLUCOSE, CAPILLARY
Glucose-Capillary: 140 mg/dL — ABNORMAL HIGH (ref 70–99)
Glucose-Capillary: 193 mg/dL — ABNORMAL HIGH (ref 70–99)
Glucose-Capillary: 101 mg/dL — ABNORMAL HIGH (ref 70–99)
Glucose-Capillary: 105 mg/dL — ABNORMAL HIGH (ref 70–99)

## 2022-01-04 LAB — BASIC METABOLIC PANEL
Anion gap: 11 (ref 5–15)
BUN: 48 mg/dL — ABNORMAL HIGH (ref 8–23)
CO2: 17 mmol/L — ABNORMAL LOW (ref 22–32)
Calcium: 10.2 mg/dL (ref 8.9–10.3)
Chloride: 110 mmol/L (ref 98–111)
Creatinine, Ser: 1.68 mg/dL — ABNORMAL HIGH (ref 0.44–1.00)
GFR, Estimated: 31 mL/min — ABNORMAL LOW (ref >60)
Glucose, Bld: 123 mg/dL — ABNORMAL HIGH (ref 70–99)
Potassium: 3.3 mmol/L — ABNORMAL LOW (ref 3.5–5.1)
Sodium: 138 mmol/L (ref 135–145)

## 2022-01-04 NOTE — Progress Notes (Signed)
PROGRESS NOTE    Kristin Berry  [id number] DOB: 1941-11-07 DOA: 01/02/2022 PCP: Shirline Frees, MD   Brief Narrative:  Kristin Berry is a 81 y.o. female with medical history significant of DM; stage 3b CKD; HTN; and HLD presenting with recurrent falls - worsening over the past 2 months. Has started using a walker recently but this was not her previous baseline. [place] most recently on 2/3 with wrist fracture.  Assessment & Plan:   Principal Problem:   AKI (acute kidney injury) ([place]) Active Problems:   Diabetes mellitus type 2 in nonobese (HCC)   Dyslipidemia   Hypertension   Stage 3b chronic kidney disease (CKD) (HCC)   Tobacco dependence   Ambulatory dysfunction   UTI (urinary tract infection)   Thyroid nodule   DNR (do not resuscitate)   Colles' fracture  Ambulatory dysfunction acute on subacute, POA Recurrent falls, recent colles wrist fracture diagnosed previously (2/4) -Likely multifactorial in the setting of poor p.o. 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 - ?related to falls -PT/OT recommending SNF, patient currently refusing SNF placement, will follow for home health and PT discharge in the next 24 to 48 hours  AKI on CKD3b present on admission, improving -Likely secondary to poor PO intake -Increase PO intake, continue IVF    Thyroid nodule- (present on admission) Lab Results  Component Value Date   TSH 0.293 (L) 01/02/2022  -[place] outpatient per PCP   Questionable UTI (urinary tract infection) - (present on admission) -Culture is mixed, likely dirty catch -Continue ceftriaxone x3 days    Anemia, likely chronic anemia of chronic disease -Secondary to history of CKD  Tobacco/nicotine dependence- (present on admission) -Encourage cessation.   -Patch ordered   Hypertension - (present  on admission) -Hold Hyzaar in the setting of AKI -Continue Norvasc, atenolol  Dyslipidemia- (present on admission) -Continue Lipitor   Non insulin dependent diabetes mellitus type 2 in nonobese [facility]) Lab Results  Component Value Date   HGBA1C 6.5 (H) 01/02/2022  - Borderline but controlled - Cover with moderate-scale SSI   DVT prophylaxis: Lovenox Code Status: DNR Family Communication: None present  Status is: Observation  Dispo: The patient is from: Home              Anticipated d/c is to: To be determined -PT recommending SNF but patient refusing, likely discharge home with home health in the next 24 to 48 hours              Anticipated d/c date is: 24 to 48 hours              Patient currently not medically stable for discharge  Consultants:  None  Procedures:  None  Antimicrobials:  Ceftriaxone x3 days  Subjective: No acute issues or events overnight  Objective: Vitals:   01/03/22 0854 01/03/22 1620 01/03/22 2025 01/04/22 0521  BP: (!) 113/57 (!) 132/47 (!) 142/96 (!) 140/59  Pulse: 66 66 73 61  Resp: 17 17 18 17   Temp: 98.1 F (36.7 C) 98.6 F (37 C) 98.1 F (36.7 C) (!) 97.4 F (36.3 C)  TempSrc: Oral Oral Oral   SpO2: 100% 100% 99% 98%  Weight:      Height:        Intake/Output Summary (Last 24 hours) at 01/04/2022 [id number] Last data filed at 01/03/2022 2336  Gross per 24 hour  Intake 1040.48 ml  Output --  Net 1040.48 ml    Filed Weights   01/02/22 1621  Weight: 47.1 kg    Examination:  General exam: Appears calm and comfortable  Respiratory system: Clear to auscultation. Respiratory effort normal. Cardiovascular system: S1 & S2 heard, RRR. No JVD, murmurs, rubs, gallops or clicks. No pedal edema. Gastrointestinal system: Abdomen is nondistended, soft and nontender. No organomegaly or masses felt. Normal bowel sounds heard. Central nervous system: Alert and oriented. No focal neurological deficits. Extremities: Symmetric 5 x 5 power. Skin:  No rashes, lesions or ulcers Psychiatry: Judgement and insight appear normal. Mood & affect appropriate.   Data Reviewed: I have personally reviewed following labs and imaging studies  CBC: Recent Labs  Lab 01/02/22 0810 01/03/22 0122  WBC 11.4* 9.1  NEUTROABS 9.2*  --   HGB 9.5* 8.8*  HCT 28.5* 27.3*  MCV 93.4 92.9  PLT 165 [id number]    Basic Metabolic Panel: Recent Labs  Lab 01/02/22 0810 01/03/22 0122 01/04/22 0302  NA 137 137 138  K 3.9 3.3* 3.3*  CL 109 112* 110  CO2 14* 16* 17*  GLUCOSE 192* 97 123*  BUN 68* 61* 48*  CREATININE 2.82* 2.50* 1.68*  CALCIUM 9.8 9.9 10.2    GFR: Estimated Creatinine Clearance: 19.2 mL/min (A) (by C-G formula based on SCr of 1.68 mg/dL (H)). Liver Function Tests: Recent Labs  Lab 01/02/22 0810  AST 14*  ALT 13  ALKPHOS 101  BILITOT 0.5  PROT 5.8*  ALBUMIN 3.2*    No results for input(s): LIPASE, AMYLASE in the last 168 hours. No results for input(s): AMMONIA in the last 168 hours. Coagulation Profile: Recent Labs  Lab 01/02/22 0810  INR 0.9    Cardiac Enzymes: No results for input(s): CKTOTAL, CKMB, CKMBINDEX, TROPONINI in the last 168 hours. BNP (last 3 results) No results for input(s): PROBNP in the last 8760 hours. HbA1C: Recent Labs    01/02/22 1653  HGBA1C 6.5*    CBG: Recent Labs  Lab 01/03/22 0836 01/03/22 1126 01/03/22 1624 01/03/22 1858 01/03/22 2026  GLUCAP 209* 135* 64* 143* 193*    Lipid Profile: No results for input(s): CHOL, HDL, LDLCALC, TRIG, CHOLHDL, LDLDIRECT in the last 72 hours. Thyroid Function Tests: Recent Labs    01/02/22 1653  TSH 0.293*    Anemia Panel: No results for input(s): VITAMINB12, FOLATE, FERRITIN, TIBC, IRON, RETICCTPCT in the last 72 hours. Sepsis Labs: No results for input(s): PROCALCITON, LATICACIDVEN in the last 168 hours.  Recent Results (from the past 240 hour(s))  Resp Panel by RT-PCR (Flu A&B, Covid) Nasopharyngeal Swab     Status: None   Collection  Time: 01/02/22  7:30 AM   Specimen: Nasopharyngeal Swab; Nasopharyngeal(NP) swabs in vial transport medium  Result Value Ref Range Status   SARS Coronavirus 2 by RT PCR NEGATIVE NEGATIVE Final    Comment: (NOTE) SARS-CoV-2 target nucleic acids are NOT DETECTED.  The SARS-CoV-2 RNA is generally detectable in upper respiratory specimens during the acute phase of infection. The lowest concentration of SARS-CoV-2 viral copies this assay can detect is 138 copies/mL. A negative result does not preclude SARS-Cov-2 infection and should not be used as the sole basis for treatment or other patient management decisions. A negative result may occur with  improper specimen collection/handling, submission of specimen other than nasopharyngeal swab, presence of viral mutation(s) within the areas targeted by this assay, and inadequate number of viral copies(<138 copies/mL). A  negative result must be combined with clinical observations, patient history, and epidemiological information. The expected result is Negative.  Fact Sheet for Patients:  EntrepreneurPulse.com.au  Fact Sheet for Healthcare Providers:  IncredibleEmployment.be  This test is no t yet approved or cleared by the [place] FDA and  has been authorized for detection and/or diagnosis of SARS-CoV-2 by FDA under an Emergency Use Authorization (EUA). This EUA will remain  in effect (meaning this test can be used) for the duration of the COVID-19 declaration under Section 564(b)(1) of the Act, 21 U.S.C.section 360bbb-3(b)(1), unless the authorization is terminated  or revoked sooner.       Influenza A by PCR NEGATIVE NEGATIVE Final   Influenza B by PCR NEGATIVE NEGATIVE Final    Comment: (NOTE) The Xpert Xpress SARS-CoV-2/FLU/RSV plus assay is intended as an aid in the diagnosis of influenza from Nasopharyngeal swab specimens and should not be used as a sole basis for treatment. Nasal washings  and aspirates are unacceptable for Xpert Xpress SARS-CoV-2/FLU/RSV testing.  Fact Sheet for Patients: EntrepreneurPulse.com.au  Fact Sheet for Healthcare Providers: IncredibleEmployment.be  This test is not yet approved or cleared by the [place] FDA and has been authorized for detection and/or diagnosis of SARS-CoV-2 by FDA under an Emergency Use Authorization (EUA). This EUA will remain in effect (meaning this test can be used) for the duration of the COVID-19 declaration under Section 564(b)(1) of the Act, 21 U.S.C. section 360bbb-3(b)(1), unless the authorization is terminated or revoked.  Performed at [facility], [place] [address] [place], [place] [address]   Urine Culture     Status: Abnormal   Collection Time: 01/02/22  8:45 AM   Specimen: Urine, Clean Catch  Result Value Ref Range Status   Specimen Description URINE, CLEAN CATCH  Final   Special Requests   Final    NONE Performed at [facility], [place] [address] [place],  [address]    Culture MULTIPLE SPECIES PRESENT, SUGGEST RECOLLECTION (A)  Final   Report Status 01/03/2022 FINAL  Final     Radiology Studies: DG Chest 1 View  Result Date: 01/02/2022 CLINICAL DATA:  81 year old female with history of pain in the lateral aspect of the right hip. EXAM: CHEST  1 VIEW COMPARISON:  No priors. FINDINGS: Lung volumes are normal. No consolidative airspace disease. No pleural effusions. No pneumothorax. No pulmonary nodule or mass noted. Pulmonary vasculature and the cardiomediastinal silhouette are within normal limits. Atherosclerosis in the thoracic aorta. IMPRESSION: 1.  No radiographic evidence of acute cardiopulmonary disease. 2. Aortic atherosclerosis. Electronically Signed   By: Vinnie Langton M.D.   On: 01/02/2022 07:54   CT Head Wo Contrast  Result Date: 01/02/2022 CLINICAL DATA:  Head trauma, minor (Age >= 65y); Neck trauma (Age >= 65y) EXAM: CT HEAD  WITHOUT CONTRAST CT CERVICAL SPINE WITHOUT CONTRAST TECHNIQUE: Multidetector CT imaging of the head and cervical spine was performed following the standard protocol without intravenous contrast. Multiplanar CT image reconstructions of the cervical spine were also generated. RADIATION DOSE REDUCTION: This exam was performed according to the departmental dose-optimization program which includes automated exposure control, adjustment of the mA and/or kV according to patient size and/or use of iterative reconstruction technique. COMPARISON:  CT head February 4, 23. FINDINGS: CT HEAD FINDINGS Brain: No evidence of acute infarction, hemorrhage, hydrocephalus, extra-axial collection or mass lesion/mass effect. Patchy white matter hypoattenuation, nonspecific but compatible with chronic microvascular ischemic disease. Mild for age atrophy. Partially empty and expanded sella.  Vascular: Calcific atherosclerosis. Skull: Left periorbital contusion.  No visible fracture. Sinuses/Orbits: Mild paranasal sinus mucosal thickening. Left maxillary sinus retention cyst. Other: No mastoid effusions. CT CERVICAL SPINE FINDINGS Alignment: No substantial sagittal subluxation. Skull base and vertebrae: Vertebral body heights are maintained. No evidence of acute fracture. Soft tissues and spinal canal: No prevertebral fluid or swelling. No visible canal hematoma. Disc levels: Multilevel degenerative disc disease, greatest at C5-C6 and C6-C7 with disc height loss, endplate sclerosis and posterior disc/osteophyte complexes. Multilevel facet and uncovertebral hypertrophy with resulting varying degrees of neural foraminal stenosis. Upper chest: Biapical pleuroparenchymal scarring.  Emphysema. Other: Partially imaged right posterior thyroid nodule, measuring 1.7 cm. Bulky calcific atherosclerosis at bilateral carotid bifurcations. IMPRESSION: CT head: 1. No evidence of acute intracranial abnormality. 2. Chronic microvascular disease. CT cervical  spine: 1. No evidence of acute fracture or traumatic malalignment. 2. Moderate multilevel degenerative change. 3. Bulky calcific atherosclerosis at bilateral carotid bifurcations. Carotid ultrasound or CTA neck could evaluate for potentially significant stenosis if clinically indicated. 4. Partially imaged right posterior thyroid nodule measuring 1.7 cm. Recommend thyroid US (ref: J Am Coll Radiol. 2015 Feb;12(2): 143-50). Electronically Signed   By: Margaretha Sheffield M.D.   On: 01/02/2022 08:56   CT Cervical Spine Wo Contrast  Result Date: 01/02/2022 CLINICAL DATA:  Head trauma, minor (Age >= 65y); Neck trauma (Age >= 65y) EXAM: CT HEAD WITHOUT CONTRAST CT CERVICAL SPINE WITHOUT CONTRAST TECHNIQUE: Multidetector CT imaging of the head and cervical spine was performed following the standard protocol without intravenous contrast. Multiplanar CT image reconstructions of the cervical spine were also generated. RADIATION DOSE REDUCTION: This exam was performed according to the departmental dose-optimization program which includes automated exposure control, adjustment of the mA and/or kV according to patient size and/or use of iterative reconstruction technique. COMPARISON:  CT head February 4, 23. FINDINGS: CT HEAD FINDINGS Brain: No evidence of acute infarction, hemorrhage, hydrocephalus, extra-axial collection or mass lesion/mass effect. Patchy white matter hypoattenuation, nonspecific but compatible with chronic microvascular ischemic disease. Mild for age atrophy. Partially empty and expanded sella. Vascular: Calcific atherosclerosis. Skull: Left periorbital contusion.  No visible fracture. Sinuses/Orbits: Mild paranasal sinus mucosal thickening. Left maxillary sinus retention cyst. Other: No mastoid effusions. CT CERVICAL SPINE FINDINGS Alignment: No substantial sagittal subluxation. Skull base and vertebrae: Vertebral body heights are maintained. No evidence of acute fracture. Soft tissues and spinal canal:  No prevertebral fluid or swelling. No visible canal hematoma. Disc levels: Multilevel degenerative disc disease, greatest at C5-C6 and C6-C7 with disc height loss, endplate sclerosis and posterior disc/osteophyte complexes. Multilevel facet and uncovertebral hypertrophy with resulting varying degrees of neural foraminal stenosis. Upper chest: Biapical pleuroparenchymal scarring.  Emphysema. Other: Partially imaged right posterior thyroid nodule, measuring 1.7 cm. Bulky calcific atherosclerosis at bilateral carotid bifurcations. IMPRESSION: CT head: 1. No evidence of acute intracranial abnormality. 2. Chronic microvascular disease. CT cervical spine: 1. No evidence of acute fracture or traumatic malalignment. 2. Moderate multilevel degenerative change. 3. Bulky calcific atherosclerosis at bilateral carotid bifurcations. Carotid ultrasound or CTA neck could evaluate for potentially significant stenosis if clinically indicated. 4. Partially imaged right posterior thyroid nodule measuring 1.7 cm. Recommend thyroid US (ref: J Am Coll Radiol. 2015 Feb;12(2): 143-50). Electronically Signed   By: Margaretha Sheffield M.D.   On: 01/02/2022 08:56   DG Hip Unilat W or Wo Pelvis 2-3 Views Right  Result Date: 01/02/2022 CLINICAL DATA:  Right hip pain after fall. EXAM: DG HIP (WITH OR WITHOUT PELVIS) 2-3V RIGHT COMPARISON:  None. FINDINGS: No acute fracture or dislocation. 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 Mild-to-moderate left hip joint space narrowing. The right hip joint space is preserved. The pubic symphysis and sacroiliac joints are intact. Soft tissues are unremarkable. IMPRESSION: 1. No acute osseous abnormality. 2. 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 Electronically Signed   By: Titus Dubin M.D.   On: 01/02/2022 07:58    Scheduled Meds:  amLODipine  10 mg  Oral Daily   aspirin  81 mg Oral Daily   atenolol  50 mg Oral BID   atorvastatin  40 mg Oral Daily   docusate sodium  100 mg Oral BID   enoxaparin (LOVENOX) injection  30 mg Subcutaneous Q24H   insulin aspart  0-5 Units Subcutaneous QHS   insulin aspart  0-9 Units Subcutaneous TID WC   nicotine  14 mg Transdermal Daily   Continuous Infusions:  cefTRIAXone (ROCEPHIN)  IV Stopped (01/03/22 1050)   lactated ringers 75 mL/hr at 01/03/22 2336    LOS: 0 days   Time spent: 27min  Faustina Gebert C Kynsie Falkner, DO [facility]  If 7PM-7AM, please contact night-coverage [email or web address]  01/04/2022, 7:18 AM

## 2022-01-04 NOTE — TOC Initial Note (Signed)
Transition of Care [facility]) - Initial/Assessment Note    Patient Details  Name: Kristin Berry MRN: [id number] Date of Birth: Mar 03, 1941  [facility]) CM/SW Contact:    Bartholomew Crews, RN Phone Number: [phone] 01/04/2022, 8:07 AM  Clinical Narrative:                  Confirmed with Delsa Sale at [facility] that referral accepted for PT and OT. Patient will need HH and Face to Face orders for PT, OT. TOC following for transition needs.   Expected Discharge Plan: [place] Barriers to Discharge: Continued Medical Work up   Patient Goals and CMS Choice Patient states their goals for this hospitalization and ongoing recovery are:: return home with family support CMS Medicare.gov Compare Post Acute Care list provided to:: Patient Choice offered to / list presented to : Patient  Expected Discharge Plan and Services Expected Discharge Plan: [place] In-house Referral: Clinical Social Work Discharge Planning Services: CM Consult Post Acute Care Choice: [place] arrangements for the past 2 months: Apartment                           HH Arranged: PT, OT HH Agency: [facility] Date [place]: 01/04/22 Time [place]: [phone] Representative spoke with at [place]: [place] Arrangements/Services Living arrangements for the past 2 months: [place] with:: Self Patient language and need for interpreter reviewed:: Yes Do you feel safe going back to the place where you live?: Yes      Need for Family Participation in Patient Care: Yes (Comment) Care giver support system in place?: Yes (comment)   Criminal Activity/Legal Involvement Pertinent to Current Situation/Hospitalization: No - Comment as needed  Activities of Daily Living Home Assistive Devices/Equipment: [place] Rile (specify type) ADL Screening (condition at time of admission) Patient's cognitive ability adequate to safely complete daily  activities?: Yes Is the patient deaf or have difficulty hearing?: No Does the patient have difficulty seeing, even when wearing glasses/contacts?: No Does the patient have difficulty concentrating, remembering, or making decisions?: No Patient able to express need for assistance with ADLs?: Yes Does the patient have difficulty dressing or bathing?: No Independently performs ADLs?: Yes (appropriate for developmental age) Does the patient have difficulty walking or climbing stairs?: Yes Weakness of Legs: Both Weakness of Arms/Hands: Left  Permission Sought/Granted                  Emotional Assessment Appearance:: Appears stated age Attitude/Demeanor/Rapport: Engaged Affect (typically observed): Accepting Orientation: : Oriented to Self, Oriented to Place, Oriented to  Time, Oriented to Situation Alcohol / Substance Use: Not Applicable Psych Involvement: No (comment)  Admission diagnosis:  Fall [W19.XXXA] AKI (acute kidney injury) ([place]) [N17.9] Fall, initial encounter [W19.XXXA] Patient Active Problem List   Diagnosis Date Noted   AKI (acute kidney injury) ([place]) 01/02/2022   Diabetes mellitus type 2 in nonobese ([place]) 01/02/2022   Dyslipidemia 01/02/2022   Hypertension 01/02/2022   Stage 3b chronic kidney disease (CKD) ([place]) 01/02/2022   Tobacco dependence 01/02/2022   Ambulatory dysfunction 01/02/2022   UTI (urinary tract infection) 01/02/2022   Thyroid nodule 01/02/2022   DNR (do not resuscitate) 01/02/2022   Colles' fracture 01/02/2022   PCP:  Shirline Frees, MD Pharmacy:   [facility] DRUG STORE [place], [place]  [place] [address] Phone: [phone] Fax: [phone]     Social Determinants of Health (SDOH) Interventions    Readmission Risk Interventions No flowsheet data found.

## 2022-01-05 LAB — BASIC METABOLIC PANEL
Anion gap: 8 (ref 5–15)
BUN: 34 mg/dL — ABNORMAL HIGH (ref 8–23)
CO2: 21 mmol/L — ABNORMAL LOW (ref 22–32)
Calcium: 9.8 mg/dL (ref 8.9–10.3)
Chloride: 110 mmol/L (ref 98–111)
Creatinine, Ser: 1.22 mg/dL — ABNORMAL HIGH (ref 0.44–1.00)
GFR, Estimated: 45 mL/min — ABNORMAL LOW (ref >60)
Glucose, Bld: 95 mg/dL (ref 70–99)
Potassium: 3.4 mmol/L — ABNORMAL LOW (ref 3.5–5.1)
Sodium: 139 mmol/L (ref 135–145)

## 2022-01-05 LAB — GLUCOSE, CAPILLARY
Glucose-Capillary: 100 mg/dL — ABNORMAL HIGH (ref 70–99)
Glucose-Capillary: 153 mg/dL — ABNORMAL HIGH (ref 70–99)

## 2022-01-05 NOTE — TOC CAGE-AID Note (Signed)
Transition of Care [facility]) - CAGE-AID Screening   Patient Details  Name: Kristin Berry MRN: [id number] Date of Birth: December 04, 1940  Transition of [facility]) CM/SW Contact:    Chayla Shands C Tarpley-Carter, [place] Phone Number: 01/05/2022, 11:31 AM   Clinical Narrative: Pt participated in [name]  Pt stated she does not use substance or ETOH.  Pt was not offered resources, due to no usage of substance or ETOH.     Stiven Kaspar Tarpley-Carter, MSW, LCSW-A Pronouns:  She/Her/Hers [place] Transitions of Care Clinical Social Worker Direct Number:  [phone] [email or web address]  CAGE-AID Screening:    Have You Ever Felt You Ought to Cut Down on Your Drinking or Drug Use?: No Have People Annoyed You By [employer] Your Drinking Or Drug Use?: No Have You Felt Bad Or Guilty About Your Drinking Or Drug Use?: No Have You Ever Had a Drink or Used Drugs First Thing In The Morning to Steady Your Nerves or to Get Rid of a Hangover?: No CAGE-AID Score: 0  Substance Abuse Education Offered: No

## 2022-01-05 NOTE — TOC Transition Note (Signed)
Transition of Care [facility]) - CM/SW Discharge Note   Patient Details  Name: Kristin Berry MRN: [id number] Date of Birth: 1941-04-12  Transition of Care [facility]) CM/SW Contact:  Tom-Johnson, Renea Ee, RN Phone Number: 01/05/2022, 10:00 AM   Clinical Narrative:     Patient is scheduled for discharge today. HHPT/OT/RN referral with [facility]. Bedside commode and tub to be delivered at bedside by [employer]. Family to transport at discharge. Denies any to her needs. No further TOC needs noted.   Final next level of care: [place] Barriers to Discharge: Barriers Resolved   Patient Goals and CMS Choice Patient states their goals for this hospitalization and ongoing recovery are:: To return home. CMS Medicare.gov Compare Post Acute Care list provided to:: Patient Choice offered to / list presented to : Patient  Discharge Placement                       Discharge Plan and Services In-house Referral: Clinical Social Work Discharge Planning Services: CM Consult Post Acute Care Choice: Home Health          DME Arranged: 3-N-1, Tub bench DME Agency: [employer] Date DME Agency Contacted: 01/05/22 Time DME Agency Contacted: [phone] Representative spoke with at DME Agency: Freda Munro HH Arranged: RN [place] Agency: [facility] Date [place]: 01/05/22 Time [place]: [phone] Representative spoke with at [place]: [place] ([place]) Interventions     Readmission Risk Interventions No flowsheet data found.

## 2022-01-05 NOTE — Discharge Summary (Signed)
Physician Discharge Summary  Kristin Berry [id number] DOB: Jun 21, 1941 DOA: 01/02/2022  PCP: Shirline Frees, MD  Admit date: 01/02/2022 Discharge date: 01/05/2022  Admitted From: Home Disposition: Home  Recommendations for Outpatient Follow-up:  Follow up with PCP in 1-2 weeks Please obtain BMP/CBC in one week Please follow up with orthopedic surgery 2/18 as scheduled  Home Health: PT Equipment/Devices: None  Discharge Condition: Stable CODE STATUS: Full Diet recommendation: Low-salt low-fat diabetic diet  Brief/Interim Summary: Kristin Berry is a 81 y.o. female with medical history significant of DM; stage 3b CKD; HTN; and HLD presenting with recurrent falls - worsening over the past 2 months. Has started using a walker recently but this was not her previous baseline. [place] most recently on 2/3 with wrist fracture.  Patient admitted as above with recurrent episodes of falls and weakness in the setting of AKI questionable UTI and hypovolemia.  With IV fluids treatment of UTI and supportive care patient's ambulatory dysfunction has markedly improved, initially PT recommending discharge to SNF but patient requesting discharge home with home health and PT while she is supported by her grandson which is certainly reasonable.  She unfortunately had recent fall with left wrist fracture somewhat limiting her ability to use ambulatory devices but has follow-up with orthopedic surgery on 01/10/2022 for further evaluation and treatment.  Patient otherwise stable and agreeable for discharge home  Assessment & Plan:   Ambulatory dysfunction acute on subacute, POA Recurrent falls, recent colles wrist fracture diagnosed previously (2/4) -Likely multifactorial in the setting of poor p.o. intake, AKI  -PT/OT recommending SNF, patient currently refusing SNF placement, will follow for home health and PT discharge in the next 24 to 48 hours  AKI on CKD3b present on admission, resolved -Likely  secondary to poor PO intake -Back to baseline   Thyroid nodule- (present on admission) Lab Results  Component Value Date   TSH 0.293 (L) 01/02/2022  -[place] outpatient as scheduled per PCP   Questionable UTI (urinary tract infection) - (present on admission) -Culture is mixed, likely dirty catch -Completed ceftriaxone x3 days    Anemia, likely chronic anemia of chronic disease -Secondary to history of CKD  Tobacco/nicotine dependence- (present on admission) -Encourage cessation.   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 [facility]) Lab Results  Component Value Date   HGBA1C 6.5 (H) 01/02/2022  - Borderline but controlled - Cover with moderate-scale SSI    Discharge Diagnoses:  Principal Problem:   AKI (acute kidney injury) ([place]) Active Problems:   Diabetes mellitus type 2 in nonobese ([place])   Dyslipidemia   Hypertension   Stage 3b chronic kidney disease (CKD) ([place])   Tobacco dependence   Ambulatory dysfunction   UTI (urinary tract infection)   Thyroid nodule   DNR (do not resuscitate)   Colles' fracture    Discharge Instructions   Allergies as of 01/05/2022       Reactions   Codeine Nausea And Vomiting   Tramadol Hcl Itching        Medication List     STOP taking these medications    losartan-hydrochlorothiazide 100-25 MG tablet Commonly known as: HYZAAR       TAKE these medications    amLODipine 10 MG tablet Commonly known as: NORVASC Take 10 mg by mouth  daily.   aspirin 81 MG chewable tablet Chew 81 mg by mouth daily.   atenolol 50 MG tablet Commonly known as: TENORMIN Take 50 mg by mouth 2 (two) times daily.   atorvastatin 40 MG  tablet Commonly known as: LIPITOR Take 40 mg by mouth daily.   carboxymethylcellulose 0.5 % Soln Commonly known as: REFRESH PLUS 1 drop 3 (three) times daily as needed (dry eyes).   cholecalciferol 25 MCG (1000 UNIT) tablet Commonly known as: VITAMIN D3 Take 1,000 Units by mouth daily.   docusate sodium 100 MG capsule Commonly known as: COLACE Take 100 mg by mouth 2 (two) times daily as needed for mild constipation.   HYDROcodone-acetaminophen 10-325 MG tablet Commonly known as: NORCO Take 1 tablet by mouth every 6 (six) hours as needed for pain.               Durable Medical Equipment  (From admission, onward)           Start     Ordered   01/05/22 1006  For home use only DME 3 n 1  Once        01/05/22 1005   01/05/22 1006  For home use only DME Tub bench  Once        01/05/22 1005            Follow-up Information     [facility].   Specialty: Home Health Services Why: the office will call to schedule home health visits Contact information: [address] [place] [address] [place] [address] [phone]                Allergies  Allergen Reactions   Codeine Nausea And Vomiting   Tramadol Hcl Itching    Consultations: None  Procedures/Studies: DG Chest 1 View  Result Date: 01/02/2022 CLINICAL DATA:  81 year old female with history of pain in the lateral aspect of the right hip. EXAM: CHEST  1 VIEW COMPARISON:  No priors. FINDINGS: Lung volumes are normal. No consolidative airspace disease. No pleural effusions. No pneumothorax. No pulmonary nodule or mass noted. Pulmonary vasculature and the cardiomediastinal silhouette are within normal limits. Atherosclerosis in the thoracic aorta. IMPRESSION: 1.  No radiographic evidence of acute cardiopulmonary disease. 2. Aortic atherosclerosis. Electronically Signed   By: Vinnie Langton M.D.   On: 01/02/2022 07:54   DG Wrist Complete Left  Result Date: 12/27/2021 CLINICAL DATA:   Trauma, fall EXAM: LEFT WRIST - COMPLETE 3+ VIEW COMPARISON:  None. FINDINGS: There is severely comminuted fracture in the distal radius. Fracture lines are extending to the articular surface. There is some over riding of fracture fragments in the dorsal aspect. Fracture is seen in the ulnar styloid. Degenerative changes are noted in first carpometacarpal joint. IMPRESSION: Comminuted displaced fracture is seen in the distal left radius. Fracture lines are involving the articular surface. There is avulsion fracture of ulnar styloid. Electronically Signed   By: Elmer Picker M.D.   On: 12/27/2021 10:59   CT Head Wo Contrast  Result Date: 01/02/2022 CLINICAL DATA:  Head trauma, minor (Age >= 65y); Neck trauma (Age >= 65y) EXAM: CT HEAD WITHOUT CONTRAST CT CERVICAL SPINE WITHOUT CONTRAST TECHNIQUE: Multidetector CT imaging of the head and cervical spine was performed following the standard protocol without intravenous contrast. Multiplanar CT image reconstructions of the cervical spine were also generated. RADIATION DOSE REDUCTION: This exam was performed according to the departmental dose-optimization program which includes automated exposure control, adjustment of  the mA and/or kV according to patient size and/or use of iterative reconstruction technique. COMPARISON:  CT head February 4, 23. FINDINGS: CT HEAD FINDINGS Brain: No evidence of acute infarction, hemorrhage, hydrocephalus, extra-axial collection or mass lesion/mass effect. Patchy white matter hypoattenuation, nonspecific but compatible with chronic microvascular ischemic disease. Mild for age atrophy. Partially empty and expanded sella. Vascular: Calcific atherosclerosis. Skull: Left periorbital contusion.  No visible fracture. Sinuses/Orbits: Mild paranasal sinus mucosal thickening. Left maxillary sinus retention cyst. Other: No mastoid effusions. CT CERVICAL SPINE FINDINGS Alignment: No substantial sagittal subluxation. Skull base and  vertebrae: Vertebral body heights are maintained. No evidence of acute fracture. Soft tissues and spinal canal: No prevertebral fluid or swelling. No visible canal hematoma. Disc levels: Multilevel degenerative disc disease, greatest at C5-C6 and C6-C7 with disc height loss, endplate sclerosis and posterior disc/osteophyte complexes. Multilevel facet and uncovertebral hypertrophy with resulting varying degrees of neural foraminal stenosis. Upper chest: Biapical pleuroparenchymal scarring.  Emphysema. Other: Partially imaged right posterior thyroid nodule, measuring 1.7 cm. Bulky calcific atherosclerosis at bilateral carotid bifurcations. IMPRESSION: CT head: 1. No evidence of acute intracranial abnormality. 2. Chronic microvascular disease. CT cervical spine: 1. No evidence of acute fracture or traumatic malalignment. 2. Moderate multilevel degenerative change. 3. Bulky calcific atherosclerosis at bilateral carotid bifurcations. Carotid ultrasound or CTA neck could evaluate for potentially significant stenosis if clinically indicated. 4. Partially imaged right posterior thyroid nodule measuring 1.7 cm. Recommend thyroid US (ref: J Am Coll Radiol. 2015 Feb;12(2): 143-50). Electronically Signed   By: Margaretha Sheffield M.D.   On: 01/02/2022 08:56   CT Head Wo Contrast  Result Date: 12/27/2021 CLINICAL DATA:  Trauma, fall EXAM: CT HEAD WITHOUT CONTRAST TECHNIQUE: Contiguous axial images were obtained from the base of the skull through the vertex without intravenous contrast. RADIATION DOSE REDUCTION: This exam was performed according to the departmental dose-optimization program which includes automated exposure control, adjustment of the mA and/or kV according to patient size and/or use of iterative reconstruction technique. COMPARISON:  None. FINDINGS: Brain: No acute intracranial findings are seen. Cortical sulci are prominent. There is decreased density in the periventricular white matter. Vascular: Unremarkable.  Skull: No fracture is seen. Sinuses/Orbits: There is mucosal thickening in the left maxillary sinus. Other: There is increased amount of CSF insula suggesting partial empty sella. IMPRESSION: No acute intracranial findings are seen in noncontrast CT brain. Atrophy. Chronic left maxillary sinusitis. Electronically Signed   By: Elmer Picker M.D.   On: 12/27/2021 11:01   CT Cervical Spine Wo Contrast  Result Date: 01/02/2022 CLINICAL DATA:  Head trauma, minor (Age >= 65y); Neck trauma (Age >= 65y) EXAM: CT HEAD WITHOUT CONTRAST CT CERVICAL SPINE WITHOUT CONTRAST TECHNIQUE: Multidetector CT imaging of the head and cervical spine was performed following the standard protocol without intravenous contrast. Multiplanar CT image reconstructions of the cervical spine were also generated. RADIATION DOSE REDUCTION: This exam was performed according to the departmental dose-optimization program which includes automated exposure control, adjustment of the mA and/or kV according to patient size and/or use of iterative reconstruction technique. COMPARISON:  CT head February 4, 23. FINDINGS: CT HEAD FINDINGS Brain: No evidence of acute infarction, hemorrhage, hydrocephalus, extra-axial collection or mass lesion/mass effect. Patchy white matter hypoattenuation, nonspecific but compatible with chronic microvascular ischemic disease. Mild for age atrophy. Partially empty and expanded sella. Vascular: Calcific atherosclerosis. Skull: Left periorbital contusion.  No visible fracture. Sinuses/Orbits: Mild paranasal sinus mucosal thickening. Left maxillary sinus retention cyst. Other: No mastoid effusions. CT CERVICAL SPINE  FINDINGS Alignment: No substantial sagittal subluxation. Skull base and vertebrae: Vertebral body heights are maintained. No evidence of acute fracture. Soft tissues and spinal canal: No prevertebral fluid or swelling. No visible canal hematoma. Disc levels: Multilevel degenerative disc disease, greatest at  C5-C6 and C6-C7 with disc height loss, endplate sclerosis and posterior disc/osteophyte complexes. Multilevel facet and uncovertebral hypertrophy with resulting varying degrees of neural foraminal stenosis. Upper chest: Biapical pleuroparenchymal scarring.  Emphysema. Other: Partially imaged right posterior thyroid nodule, measuring 1.7 cm. Bulky calcific atherosclerosis at bilateral carotid bifurcations. IMPRESSION: CT head: 1. No evidence of acute intracranial abnormality. 2. Chronic microvascular disease. CT cervical spine: 1. No evidence of acute fracture or traumatic malalignment. 2. Moderate multilevel degenerative change. 3. Bulky calcific atherosclerosis at bilateral carotid bifurcations. Carotid ultrasound or CTA neck could evaluate for potentially significant stenosis if clinically indicated. 4. Partially imaged right posterior thyroid nodule measuring 1.7 cm. Recommend thyroid US (ref: J Am Coll Radiol. 2015 Feb;12(2): 143-50). Electronically Signed   By: Margaretha Sheffield M.D.   On: 01/02/2022 08:56   DG Hip Unilat W or Wo Pelvis 2-3 Views Right  Result Date: 01/02/2022 CLINICAL DATA:  Right hip pain after fall. EXAM: DG HIP (WITH OR WITHOUT PELVIS) 2-3V RIGHT COMPARISON:  None. FINDINGS: No acute fracture or dislocation. 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 Mild-to-moderate left hip joint space narrowing. The right hip joint space is preserved. The pubic symphysis and sacroiliac joints are intact. Soft tissues are unremarkable. IMPRESSION: 1. No acute osseous abnormality. 2. 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 Electronically Signed   By: Titus Dubin M.D.   On: 01/02/2022 07:58     Subjective: No acute issues or events overnight   Discharge Exam: Vitals:   01/05/22 0441 01/05/22 0923  BP: (!) 145/58 (!) 149/63  Pulse: 64 63   Resp: 18 18  Temp: 98.4 F (36.9 C) (!) 97.5 F (36.4 C)  SpO2: 95% 99%   Vitals:   01/04/22 1643 01/04/22 2113 01/05/22 0441 01/05/22 0923  BP: (!) 139/49 (!) 150/56 (!) 145/58 (!) 149/63  Pulse: 60 61 64 63  Resp: 18 16 18 18   Temp: 98 F (36.7 C) 98.5 F (36.9 C) 98.4 F (36.9 C) (!) 97.5 F (36.4 C)  TempSrc:  Oral Oral Oral  SpO2: 100% 99% 95% 99%  Weight:      Height:        General: Pt is alert, awake, not in acute distress Cardiovascular: RRR, S1/S2 +, no rubs, no gallops Respiratory: CTA bilaterally, no wheezing, no rhonchi Abdominal: Soft, NT, ND, bowel sounds + Extremities: no edema, no cyanosis; left arm splint and bandage clean dry intact    The results of significant diagnostics from this hospitalization (including imaging, microbiology, ancillary and laboratory) are listed below for reference.     Microbiology: Recent Results (from the past 240 hour(s))  Resp Panel by RT-PCR (Flu A&B, Covid) Nasopharyngeal Swab     Status: None   Collection Time: 01/02/22  7:30 AM   Specimen: Nasopharyngeal Swab; Nasopharyngeal(NP) swabs in vial transport medium  Result Value Ref Range Status   SARS Coronavirus 2 by RT PCR NEGATIVE NEGATIVE Final    Comment: (NOTE) SARS-CoV-2 target nucleic acids are NOT DETECTED.  The SARS-CoV-2 RNA is generally detectable in upper respiratory specimens during the acute phase of infection. The lowest concentration of SARS-CoV-2 viral copies this assay can  detect is 138 copies/mL. A negative result does not preclude SARS-Cov-2 infection and should not be used as the sole basis for treatment or other patient management decisions. A negative result may occur with  improper specimen collection/handling, submission of specimen other than nasopharyngeal swab, presence of viral mutation(s) within the areas targeted by this assay, and inadequate number of viral copies(<138 copies/mL). A negative result must be combined with clinical  observations, patient history, and epidemiological information. The expected result is Negative.  Fact Sheet for Patients:  BloggerCourse.com  Fact Sheet for Healthcare Providers:  SeriousBroker.it  This test is no t yet approved or cleared by the [place] FDA and  has been authorized for detection and/or diagnosis of SARS-CoV-2 by FDA under an Emergency Use Authorization (EUA). This EUA will remain  in effect (meaning this test can be used) for the duration of the COVID-19 declaration under Section 564(b)(1) of the Act, 21 U.S.C.section 360bbb-3(b)(1), unless the authorization is terminated  or revoked sooner.       Influenza A by PCR NEGATIVE NEGATIVE Final   Influenza B by PCR NEGATIVE NEGATIVE Final    Comment: (NOTE) The Xpert Xpress SARS-CoV-2/FLU/RSV plus assay is intended as an aid in the diagnosis of influenza from Nasopharyngeal swab specimens and should not be used as a sole basis for treatment. Nasal washings and aspirates are unacceptable for Xpert Xpress SARS-CoV-2/FLU/RSV testing.  Fact Sheet for Patients: BloggerCourse.com  Fact Sheet for Healthcare Providers: SeriousBroker.it  This test is not yet approved or cleared by the [place] FDA and has been authorized for detection and/or diagnosis of SARS-CoV-2 by FDA under an Emergency Use Authorization (EUA). This EUA will remain in effect (meaning this test can be used) for the duration of the COVID-19 declaration under Section 564(b)(1) of the Act, 21 U.S.C. section 360bbb-3(b)(1), unless the authorization is terminated or revoked.  Performed at [facility], [address] [place], [place] [address]   Urine Culture     Status: Abnormal   Collection Time: 01/02/22  8:45 AM   Specimen: Urine, Clean Catch  Result Value Ref Range Status   Specimen Description URINE, CLEAN CATCH  Final    Special Requests   Final    NONE Performed at [facility], [address], [place], [place] [address]    Culture MULTIPLE SPECIES PRESENT, SUGGEST RECOLLECTION (A)  Final   Report Status 01/03/2022 FINAL  Final     Labs: BNP (last 3 results) No results for input(s): BNP in the last 8760 hours. Basic Metabolic Panel: Recent Labs  Lab 01/02/22 0810 01/03/22 0122 01/04/22 0302 01/05/22 0300  NA 137 137 138 139  K 3.9 3.3* 3.3* 3.4*  CL 109 112* 110 110  CO2 14* 16* 17* 21*  GLUCOSE 192* 97 123* 95  BUN 68* 61* 48* 34*  CREATININE 2.82* 2.50* 1.68* 1.22*  CALCIUM 9.8 9.9 10.2 9.8   Liver Function Tests: Recent Labs  Lab 01/02/22 0810  AST 14*  ALT 13  ALKPHOS 101  BILITOT 0.5  PROT 5.8*  ALBUMIN 3.2*   No results for input(s): LIPASE, AMYLASE in the last 168 hours. No results for input(s): AMMONIA in the last 168 hours. CBC: Recent Labs  Lab 01/02/22 0810 01/03/22 0122  WBC 11.4* 9.1  NEUTROABS 9.2*  --   HGB 9.5* 8.8*  HCT 28.5* 27.3*  MCV 93.4 92.9  PLT 165 156   Cardiac Enzymes: No results for input(s): CKTOTAL, CKMB, CKMBINDEX, TROPONINI in  the last 168 hours. BNP: Invalid input(s): POCBNP CBG: Recent Labs  Lab 01/04/22 1126 01/04/22 1642 01/04/22 2115 01/05/22 0728 01/05/22 1140  GLUCAP 193* 101* 105* 100* 153*   D-Dimer No results for input(s): DDIMER in the last 72 hours. Hgb A1c Recent Labs    01/02/22 1653  HGBA1C 6.5*   Lipid Profile No results for input(s): CHOL, HDL, LDLCALC, TRIG, CHOLHDL, LDLDIRECT in the last 72 hours. Thyroid function studies Recent Labs    01/02/22 1653  TSH 0.293*   Anemia work up No results for input(s): VITAMINB12, FOLATE, FERRITIN, TIBC, IRON, RETICCTPCT in the last 72 hours. Urinalysis    Component Value Date/Time   COLORURINE YELLOW 01/02/2022 0810   APPEARANCEUR CLOUDY (A) 01/02/2022 0810   LABSPEC 1.014 01/02/2022 0810   PHURINE 5.0 01/02/2022 0810   GLUCOSEU NEGATIVE 01/02/2022  0810   HGBUR MODERATE (A) 01/02/2022 0810   BILIRUBINUR NEGATIVE 01/02/2022 0810   KETONESUR NEGATIVE 01/02/2022 0810   PROTEINUR 100 (A) 01/02/2022 0810   NITRITE NEGATIVE 01/02/2022 0810   LEUKOCYTESUR TRACE (A) 01/02/2022 0810   Sepsis Labs Invalid input(s): PROCALCITONIN,  WBC,  LACTICIDVEN Microbiology Recent Results (from the past 240 hour(s))  Resp Panel by RT-PCR (Flu A&B, Covid) Nasopharyngeal Swab     Status: None   Collection Time: 01/02/22  7:30 AM   Specimen: Nasopharyngeal Swab; Nasopharyngeal(NP) swabs in vial transport medium  Result Value Ref Range Status   SARS Coronavirus 2 by RT PCR NEGATIVE NEGATIVE Final    Comment: (NOTE) SARS-CoV-2 target nucleic acids are NOT DETECTED.  The SARS-CoV-2 RNA is generally detectable in upper respiratory specimens during the acute phase of infection. The lowest concentration of SARS-CoV-2 viral copies this assay can detect is 138 copies/mL. A negative result does not preclude SARS-Cov-2 infection and should not be used as the sole basis for treatment or other patient management decisions. A negative result may occur with  improper specimen collection/handling, submission of specimen other than nasopharyngeal swab, presence of viral mutation(s) within the areas targeted by this assay, and inadequate number of viral copies(<138 copies/mL). A negative result must be combined with clinical observations, patient history, and epidemiological information. The expected result is Negative.  Fact Sheet for Patients:  BloggerCourse.com  Fact Sheet for Healthcare Providers:  SeriousBroker.it  This test is no t yet approved or cleared by the [place] FDA and  has been authorized for detection and/or diagnosis of SARS-CoV-2 by FDA under an Emergency Use Authorization (EUA). This EUA will remain  in effect (meaning this test can be used) for the duration of the COVID-19 declaration  under Section 564(b)(1) of the Act, 21 U.S.C.section 360bbb-3(b)(1), unless the authorization is terminated  or revoked sooner.       Influenza A by PCR NEGATIVE NEGATIVE Final   Influenza B by PCR NEGATIVE NEGATIVE Final    Comment: (NOTE) The Xpert Xpress SARS-CoV-2/FLU/RSV plus assay is intended as an aid in the diagnosis of influenza from Nasopharyngeal swab specimens and should not be used as a sole basis for treatment. Nasal washings and aspirates are unacceptable for Xpert Xpress SARS-CoV-2/FLU/RSV testing.  Fact Sheet for Patients: BloggerCourse.com  Fact Sheet for Healthcare Providers: SeriousBroker.it  This test is not yet approved or cleared by the [place] FDA and has been authorized for detection and/or diagnosis of SARS-CoV-2 by FDA under an Emergency Use Authorization (EUA). This EUA will remain in effect (meaning this test can be used) for the duration of the COVID-19 declaration under Section  564(b)(1) of the Act, 21 U.S.C. section 360bbb-3(b)(1), unless the authorization is terminated or revoked.  Performed at [facility], [place] [address], [place], [place] [address]   Urine Culture     Status: Abnormal   Collection Time: 01/02/22  8:45 AM   Specimen: Urine, Clean Catch  Result Value Ref Range Status   Specimen Description URINE, CLEAN CATCH  Final   Special Requests   Final    NONE Performed at [facility], [place] [address] [place],  [address]    Culture MULTIPLE SPECIES PRESENT, SUGGEST RECOLLECTION (A)  Final   Report Status 01/03/2022 FINAL  Final     Time coordinating discharge: Over 30 minutes  SIGNED:   Little Ishikawa, DO [facility] 01/05/2022, 1:43 PM Pager   If 7PM-7AM, please contact night-coverage [email or web address]

## 2022-01-13 ENCOUNTER — Other Ambulatory Visit: Payer: Self-pay | Admitting: Family Medicine

## 2022-01-13 DIAGNOSIS — M150 Primary generalized (osteo)arthritis: Secondary | ICD-10-CM | POA: Diagnosis not present

## 2022-01-13 DIAGNOSIS — S62102D Fracture of unspecified carpal bone, left wrist, subsequent encounter for fracture with routine healing: Secondary | ICD-10-CM | POA: Diagnosis not present

## 2022-01-13 DIAGNOSIS — N1831 Chronic kidney disease, stage 3a: Secondary | ICD-10-CM | POA: Diagnosis not present

## 2022-01-13 DIAGNOSIS — E1122 Type 2 diabetes mellitus with diabetic chronic kidney disease: Secondary | ICD-10-CM | POA: Diagnosis not present

## 2022-01-13 DIAGNOSIS — N183 Chronic kidney disease, stage 3 unspecified: Secondary | ICD-10-CM | POA: Diagnosis not present

## 2022-01-13 DIAGNOSIS — E7800 Pure hypercholesterolemia, unspecified: Secondary | ICD-10-CM | POA: Diagnosis not present

## 2022-01-13 DIAGNOSIS — I10 Essential (primary) hypertension: Secondary | ICD-10-CM | POA: Diagnosis not present

## 2022-01-13 DIAGNOSIS — D631 Anemia in chronic kidney disease: Secondary | ICD-10-CM | POA: Diagnosis not present

## 2022-01-13 DIAGNOSIS — E041 Nontoxic single thyroid nodule: Secondary | ICD-10-CM

## 2022-01-13 DIAGNOSIS — I6523 Occlusion and stenosis of bilateral carotid arteries: Secondary | ICD-10-CM

## 2022-01-13 DIAGNOSIS — D649 Anemia, unspecified: Secondary | ICD-10-CM | POA: Diagnosis not present

## 2022-01-16 DIAGNOSIS — S52532A Colles' fracture of left radius, initial encounter for closed fracture: Secondary | ICD-10-CM | POA: Diagnosis not present

## 2022-02-03 DIAGNOSIS — E041 Nontoxic single thyroid nodule: Secondary | ICD-10-CM | POA: Diagnosis not present

## 2022-02-03 DIAGNOSIS — I10 Essential (primary) hypertension: Secondary | ICD-10-CM | POA: Diagnosis not present

## 2022-02-03 DIAGNOSIS — E7800 Pure hypercholesterolemia, unspecified: Secondary | ICD-10-CM | POA: Diagnosis not present

## 2022-02-03 DIAGNOSIS — I6523 Occlusion and stenosis of bilateral carotid arteries: Secondary | ICD-10-CM | POA: Diagnosis not present

## 2022-02-03 DIAGNOSIS — M150 Primary generalized (osteo)arthritis: Secondary | ICD-10-CM | POA: Diagnosis not present

## 2022-02-03 DIAGNOSIS — R600 Localized edema: Secondary | ICD-10-CM | POA: Diagnosis not present

## 2022-02-03 DIAGNOSIS — N183 Chronic kidney disease, stage 3 unspecified: Secondary | ICD-10-CM | POA: Diagnosis not present

## 2022-02-03 DIAGNOSIS — E1122 Type 2 diabetes mellitus with diabetic chronic kidney disease: Secondary | ICD-10-CM | POA: Diagnosis not present

## 2022-02-03 DIAGNOSIS — F17200 Nicotine dependence, unspecified, uncomplicated: Secondary | ICD-10-CM | POA: Diagnosis not present

## 2022-02-05 DIAGNOSIS — S52532A Colles' fracture of left radius, initial encounter for closed fracture: Secondary | ICD-10-CM | POA: Diagnosis not present

## 2022-02-19 DIAGNOSIS — S52532D Colles' fracture of left radius, subsequent encounter for closed fracture with routine healing: Secondary | ICD-10-CM | POA: Diagnosis not present

## 2022-03-12 DIAGNOSIS — N1832 Chronic kidney disease, stage 3b: Secondary | ICD-10-CM | POA: Diagnosis not present

## 2022-03-12 DIAGNOSIS — M150 Primary generalized (osteo)arthritis: Secondary | ICD-10-CM | POA: Diagnosis not present

## 2022-03-12 DIAGNOSIS — E041 Nontoxic single thyroid nodule: Secondary | ICD-10-CM | POA: Diagnosis not present

## 2022-03-12 DIAGNOSIS — D631 Anemia in chronic kidney disease: Secondary | ICD-10-CM | POA: Diagnosis not present

## 2022-03-12 DIAGNOSIS — I10 Essential (primary) hypertension: Secondary | ICD-10-CM | POA: Diagnosis not present

## 2022-03-12 DIAGNOSIS — I959 Hypotension, unspecified: Secondary | ICD-10-CM | POA: Diagnosis not present

## 2022-03-12 DIAGNOSIS — S62102D Fracture of unspecified carpal bone, left wrist, subsequent encounter for fracture with routine healing: Secondary | ICD-10-CM | POA: Diagnosis not present

## 2022-03-12 DIAGNOSIS — E1122 Type 2 diabetes mellitus with diabetic chronic kidney disease: Secondary | ICD-10-CM | POA: Diagnosis not present

## 2022-03-12 DIAGNOSIS — I6523 Occlusion and stenosis of bilateral carotid arteries: Secondary | ICD-10-CM | POA: Diagnosis not present

## 2022-06-10 DIAGNOSIS — I6523 Occlusion and stenosis of bilateral carotid arteries: Secondary | ICD-10-CM | POA: Diagnosis not present

## 2022-06-10 DIAGNOSIS — E1122 Type 2 diabetes mellitus with diabetic chronic kidney disease: Secondary | ICD-10-CM | POA: Diagnosis not present

## 2022-06-10 DIAGNOSIS — D638 Anemia in other chronic diseases classified elsewhere: Secondary | ICD-10-CM | POA: Diagnosis not present

## 2022-06-10 DIAGNOSIS — F17200 Nicotine dependence, unspecified, uncomplicated: Secondary | ICD-10-CM | POA: Diagnosis not present

## 2022-06-10 DIAGNOSIS — I10 Essential (primary) hypertension: Secondary | ICD-10-CM | POA: Diagnosis not present

## 2022-06-10 DIAGNOSIS — E041 Nontoxic single thyroid nodule: Secondary | ICD-10-CM | POA: Diagnosis not present

## 2022-06-10 DIAGNOSIS — N183 Chronic kidney disease, stage 3 unspecified: Secondary | ICD-10-CM | POA: Diagnosis not present

## 2022-06-10 DIAGNOSIS — M150 Primary generalized (osteo)arthritis: Secondary | ICD-10-CM | POA: Diagnosis not present

## 2022-06-10 DIAGNOSIS — E7800 Pure hypercholesterolemia, unspecified: Secondary | ICD-10-CM | POA: Diagnosis not present

## 2022-08-21 DIAGNOSIS — Z043 Encounter for examination and observation following other accident: Secondary | ICD-10-CM | POA: Diagnosis not present

## 2022-08-21 DIAGNOSIS — J189 Pneumonia, unspecified organism: Secondary | ICD-10-CM | POA: Diagnosis not present

## 2022-08-21 DIAGNOSIS — I340 Nonrheumatic mitral (valve) insufficiency: Secondary | ICD-10-CM | POA: Diagnosis not present

## 2022-08-21 DIAGNOSIS — J449 Chronic obstructive pulmonary disease, unspecified: Secondary | ICD-10-CM | POA: Diagnosis not present

## 2022-08-21 DIAGNOSIS — E785 Hyperlipidemia, unspecified: Secondary | ICD-10-CM | POA: Diagnosis not present

## 2022-08-21 DIAGNOSIS — I5031 Acute diastolic (congestive) heart failure: Secondary | ICD-10-CM | POA: Diagnosis not present

## 2022-08-21 DIAGNOSIS — E119 Type 2 diabetes mellitus without complications: Secondary | ICD-10-CM | POA: Diagnosis not present

## 2022-08-21 DIAGNOSIS — R296 Repeated falls: Secondary | ICD-10-CM | POA: Diagnosis not present

## 2022-08-21 DIAGNOSIS — A419 Sepsis, unspecified organism: Secondary | ICD-10-CM | POA: Diagnosis not present

## 2022-08-21 DIAGNOSIS — I110 Hypertensive heart disease with heart failure: Secondary | ICD-10-CM | POA: Diagnosis not present

## 2022-08-21 DIAGNOSIS — R0602 Shortness of breath: Secondary | ICD-10-CM | POA: Diagnosis not present

## 2022-08-21 DIAGNOSIS — R059 Cough, unspecified: Secondary | ICD-10-CM | POA: Diagnosis not present

## 2022-08-21 DIAGNOSIS — R911 Solitary pulmonary nodule: Secondary | ICD-10-CM | POA: Diagnosis not present

## 2022-08-21 DIAGNOSIS — S2242XA Multiple fractures of ribs, left side, initial encounter for closed fracture: Secondary | ICD-10-CM | POA: Diagnosis not present

## 2022-08-21 DIAGNOSIS — J90 Pleural effusion, not elsewhere classified: Secondary | ICD-10-CM | POA: Diagnosis not present

## 2022-08-21 DIAGNOSIS — I6523 Occlusion and stenosis of bilateral carotid arteries: Secondary | ICD-10-CM | POA: Diagnosis not present

## 2022-08-21 DIAGNOSIS — I10 Essential (primary) hypertension: Secondary | ICD-10-CM | POA: Diagnosis not present

## 2022-08-22 DIAGNOSIS — E119 Type 2 diabetes mellitus without complications: Secondary | ICD-10-CM | POA: Diagnosis not present

## 2022-08-22 DIAGNOSIS — R296 Repeated falls: Secondary | ICD-10-CM | POA: Diagnosis not present

## 2022-08-22 DIAGNOSIS — I110 Hypertensive heart disease with heart failure: Secondary | ICD-10-CM | POA: Diagnosis not present

## 2022-08-22 DIAGNOSIS — I6523 Occlusion and stenosis of bilateral carotid arteries: Secondary | ICD-10-CM | POA: Diagnosis not present

## 2022-08-22 DIAGNOSIS — I10 Essential (primary) hypertension: Secondary | ICD-10-CM | POA: Diagnosis not present

## 2022-08-22 DIAGNOSIS — Z043 Encounter for examination and observation following other accident: Secondary | ICD-10-CM | POA: Diagnosis not present

## 2022-08-22 DIAGNOSIS — S2242XA Multiple fractures of ribs, left side, initial encounter for closed fracture: Secondary | ICD-10-CM | POA: Diagnosis not present

## 2022-08-22 DIAGNOSIS — I5031 Acute diastolic (congestive) heart failure: Secondary | ICD-10-CM | POA: Diagnosis not present

## 2022-08-23 DIAGNOSIS — I110 Hypertensive heart disease with heart failure: Secondary | ICD-10-CM | POA: Diagnosis not present

## 2022-08-23 DIAGNOSIS — S2242XA Multiple fractures of ribs, left side, initial encounter for closed fracture: Secondary | ICD-10-CM | POA: Diagnosis not present

## 2022-08-23 DIAGNOSIS — I5031 Acute diastolic (congestive) heart failure: Secondary | ICD-10-CM | POA: Diagnosis not present

## 2022-08-24 DIAGNOSIS — I5031 Acute diastolic (congestive) heart failure: Secondary | ICD-10-CM | POA: Diagnosis not present

## 2022-08-24 DIAGNOSIS — I110 Hypertensive heart disease with heart failure: Secondary | ICD-10-CM | POA: Diagnosis not present

## 2022-08-24 DIAGNOSIS — S2242XA Multiple fractures of ribs, left side, initial encounter for closed fracture: Secondary | ICD-10-CM | POA: Diagnosis not present

## 2022-08-25 DIAGNOSIS — S2242XA Multiple fractures of ribs, left side, initial encounter for closed fracture: Secondary | ICD-10-CM | POA: Diagnosis not present

## 2022-08-25 DIAGNOSIS — I110 Hypertensive heart disease with heart failure: Secondary | ICD-10-CM | POA: Diagnosis not present

## 2022-08-25 DIAGNOSIS — I5031 Acute diastolic (congestive) heart failure: Secondary | ICD-10-CM | POA: Diagnosis not present

## 2022-08-26 DIAGNOSIS — I5031 Acute diastolic (congestive) heart failure: Secondary | ICD-10-CM | POA: Diagnosis not present

## 2022-08-26 DIAGNOSIS — S2242XA Multiple fractures of ribs, left side, initial encounter for closed fracture: Secondary | ICD-10-CM | POA: Diagnosis not present

## 2022-08-26 DIAGNOSIS — I110 Hypertensive heart disease with heart failure: Secondary | ICD-10-CM | POA: Diagnosis not present

## 2022-09-01 DIAGNOSIS — F17200 Nicotine dependence, unspecified, uncomplicated: Secondary | ICD-10-CM | POA: Diagnosis not present

## 2022-09-01 DIAGNOSIS — D649 Anemia, unspecified: Secondary | ICD-10-CM | POA: Diagnosis not present

## 2022-09-01 DIAGNOSIS — I5022 Chronic systolic (congestive) heart failure: Secondary | ICD-10-CM | POA: Diagnosis not present

## 2022-09-01 DIAGNOSIS — Z23 Encounter for immunization: Secondary | ICD-10-CM | POA: Diagnosis not present

## 2022-09-01 DIAGNOSIS — N1832 Chronic kidney disease, stage 3b: Secondary | ICD-10-CM | POA: Diagnosis not present

## 2022-09-01 DIAGNOSIS — E1122 Type 2 diabetes mellitus with diabetic chronic kidney disease: Secondary | ICD-10-CM | POA: Diagnosis not present

## 2022-09-01 DIAGNOSIS — D638 Anemia in other chronic diseases classified elsewhere: Secondary | ICD-10-CM | POA: Diagnosis not present

## 2022-09-01 DIAGNOSIS — E041 Nontoxic single thyroid nodule: Secondary | ICD-10-CM | POA: Diagnosis not present

## 2022-09-01 DIAGNOSIS — I6523 Occlusion and stenosis of bilateral carotid arteries: Secondary | ICD-10-CM | POA: Diagnosis not present

## 2022-09-01 DIAGNOSIS — M150 Primary generalized (osteo)arthritis: Secondary | ICD-10-CM | POA: Diagnosis not present

## 2022-10-20 DIAGNOSIS — H5213 Myopia, bilateral: Secondary | ICD-10-CM | POA: Diagnosis not present

## 2022-10-20 DIAGNOSIS — H524 Presbyopia: Secondary | ICD-10-CM | POA: Diagnosis not present

## 2023-01-13 DIAGNOSIS — E7800 Pure hypercholesterolemia, unspecified: Secondary | ICD-10-CM | POA: Diagnosis not present

## 2023-01-13 DIAGNOSIS — I5031 Acute diastolic (congestive) heart failure: Secondary | ICD-10-CM | POA: Diagnosis not present

## 2023-01-13 DIAGNOSIS — E1122 Type 2 diabetes mellitus with diabetic chronic kidney disease: Secondary | ICD-10-CM | POA: Diagnosis not present

## 2023-01-13 DIAGNOSIS — I10 Essential (primary) hypertension: Secondary | ICD-10-CM | POA: Diagnosis not present

## 2023-01-25 ENCOUNTER — Telehealth: Payer: Self-pay | Admitting: *Deleted

## 2023-01-25 NOTE — Telephone Encounter (Signed)
   Telephone encounter was:  Unsuccessful.  01/25/2023 Name: Kristin Berry MRN: [id number] DOB: 1941/06/12  Unsuccessful outbound call made today to assist with:  Transportation Needs   Outreach Attempt:  2nd Attempt  A HIPAA compliant voice message was left requesting a return call.  Instructed patient to call back at [phone].  [place] [phone] [address] , [place] [address] Email : [email or web address]

## 2023-01-25 NOTE — Telephone Encounter (Signed)
   Telephone encounter was:  Unsuccessful.  01/25/2023 Name: Kristin Berry MRN: [id number] DOB: 12/18/1940  Unsuccessful outbound call made today to assist with:  Transportation Needs   Outreach Attempt:  1st Attempt  A HIPAA compliant voice message was left requesting a return call.  Instructed patient to call back at [phone].  [place] [phone] [address] , [place] [address] Email : [email or web address]

## 2023-01-26 ENCOUNTER — Telehealth: Payer: Self-pay | Admitting: *Deleted

## 2023-01-26 NOTE — Telephone Encounter (Signed)
   Telephone encounter was:  Successful.  01/26/2023 Name: SHEINDEL MANS MRN: [id number] DOB: [birth year]  Alain Honey Blais is a 82 y.o. year old female who is a primary care patient of Shirline Frees, MD . The community resource team was consulted for assistance with Transportation Needs   I finally reached her on the phone now to cancel the appt and try to get them to not charge her [place] and office staff told her to call the concierge line she does have [place] 24 rides but of course to late to book with them and she will need a door to door to pick up by 1030 and is having issues with her phone  which is why she has not called me back , she now knows how to book with [facility] so I told the i would call the office as she was very upset that she might have to pay to cancel , 24 one way trips no more than 150 miles 72 hours in advance trip [place] care  [phone]     Care guide performed the following interventions: Patient provided with information about care guide support team and interviewed to confirm resource needs.  Follow Up Plan:  No further follow up planned at this time. The patient has been provided with needed resources.  [facility] [phone] [address] , [place] [address] Email : [email or web address]

## 2023-02-16 DIAGNOSIS — I130 Hypertensive heart and chronic kidney disease with heart failure and stage 1 through stage 4 chronic kidney disease, or unspecified chronic kidney disease: Secondary | ICD-10-CM | POA: Diagnosis not present

## 2023-02-16 DIAGNOSIS — E041 Nontoxic single thyroid nodule: Secondary | ICD-10-CM | POA: Diagnosis not present

## 2023-02-16 DIAGNOSIS — E1122 Type 2 diabetes mellitus with diabetic chronic kidney disease: Secondary | ICD-10-CM | POA: Diagnosis not present

## 2023-02-16 DIAGNOSIS — I5022 Chronic systolic (congestive) heart failure: Secondary | ICD-10-CM | POA: Diagnosis not present

## 2023-02-16 DIAGNOSIS — E1165 Type 2 diabetes mellitus with hyperglycemia: Secondary | ICD-10-CM | POA: Diagnosis not present

## 2023-02-16 DIAGNOSIS — E7800 Pure hypercholesterolemia, unspecified: Secondary | ICD-10-CM | POA: Diagnosis not present

## 2023-02-16 DIAGNOSIS — D638 Anemia in other chronic diseases classified elsewhere: Secondary | ICD-10-CM | POA: Diagnosis not present

## 2023-02-16 DIAGNOSIS — N1832 Chronic kidney disease, stage 3b: Secondary | ICD-10-CM | POA: Diagnosis not present

## 2023-02-16 DIAGNOSIS — D649 Anemia, unspecified: Secondary | ICD-10-CM | POA: Diagnosis not present

## 2023-02-16 DIAGNOSIS — R6889 Other general symptoms and signs: Secondary | ICD-10-CM | POA: Diagnosis not present

## 2023-02-16 DIAGNOSIS — R918 Other nonspecific abnormal finding of lung field: Secondary | ICD-10-CM | POA: Diagnosis not present

## 2023-08-26 DIAGNOSIS — R911 Solitary pulmonary nodule: Secondary | ICD-10-CM | POA: Diagnosis not present

## 2023-08-26 DIAGNOSIS — I6523 Occlusion and stenosis of bilateral carotid arteries: Secondary | ICD-10-CM | POA: Diagnosis not present

## 2023-08-26 DIAGNOSIS — Z9989 Dependence on other enabling machines and devices: Secondary | ICD-10-CM | POA: Diagnosis not present

## 2023-08-26 DIAGNOSIS — E1122 Type 2 diabetes mellitus with diabetic chronic kidney disease: Secondary | ICD-10-CM | POA: Diagnosis not present

## 2023-08-26 DIAGNOSIS — E7800 Pure hypercholesterolemia, unspecified: Secondary | ICD-10-CM | POA: Diagnosis not present

## 2023-08-26 DIAGNOSIS — I130 Hypertensive heart and chronic kidney disease with heart failure and stage 1 through stage 4 chronic kidney disease, or unspecified chronic kidney disease: Secondary | ICD-10-CM | POA: Diagnosis not present

## 2023-08-26 DIAGNOSIS — I509 Heart failure, unspecified: Secondary | ICD-10-CM | POA: Diagnosis not present

## 2023-08-26 DIAGNOSIS — E041 Nontoxic single thyroid nodule: Secondary | ICD-10-CM | POA: Diagnosis not present

## 2023-08-26 DIAGNOSIS — I10 Essential (primary) hypertension: Secondary | ICD-10-CM | POA: Diagnosis not present

## 2023-08-26 DIAGNOSIS — R6889 Other general symptoms and signs: Secondary | ICD-10-CM | POA: Diagnosis not present

## 2023-08-26 DIAGNOSIS — Z23 Encounter for immunization: Secondary | ICD-10-CM | POA: Diagnosis not present

## 2023-09-16 DIAGNOSIS — R6889 Other general symptoms and signs: Secondary | ICD-10-CM | POA: Diagnosis not present

## 2023-09-16 DIAGNOSIS — H26493 Other secondary cataract, bilateral: Secondary | ICD-10-CM | POA: Diagnosis not present

## 2024-01-23 IMAGING — CT CT CERVICAL SPINE W/O CM
3 of 6 series · 13 of 33 positions shown, 15 images · non-contrast
Comparison: CT head December 27, 21.

CLINICAL DATA: Head trauma, minor (Age >= 65y); Neck trauma (Age >=
65y)



[Series 5: head 3.0 mpr cor · coronal · 0.32mm/px · 3 of 67 slices shown]
[im 17/67  bone]
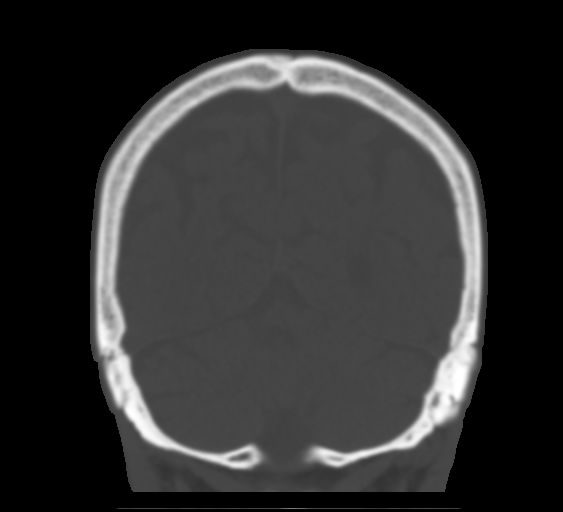
[im 34/67  bone]
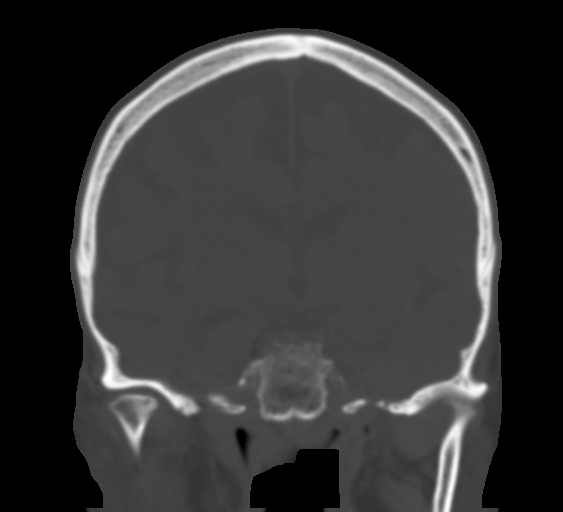
[im 50/67  bone]
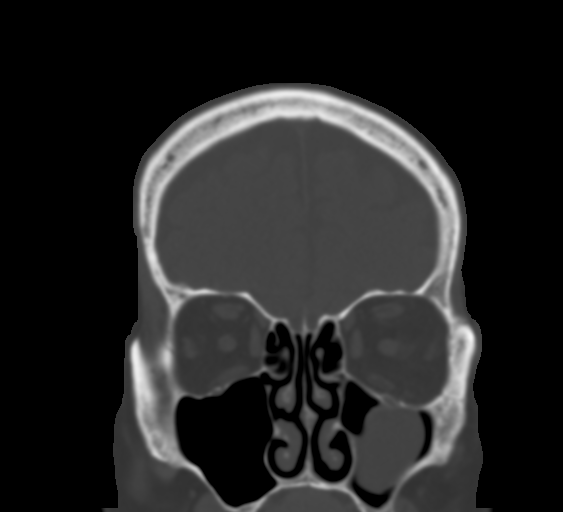

[Series 8: c_spine 2.0 st · axial · 0.30mm/px · z∈[-190,-68]mm · 6 of 79 slices shown, 8 images]
[im 9/79  soft-tissue]
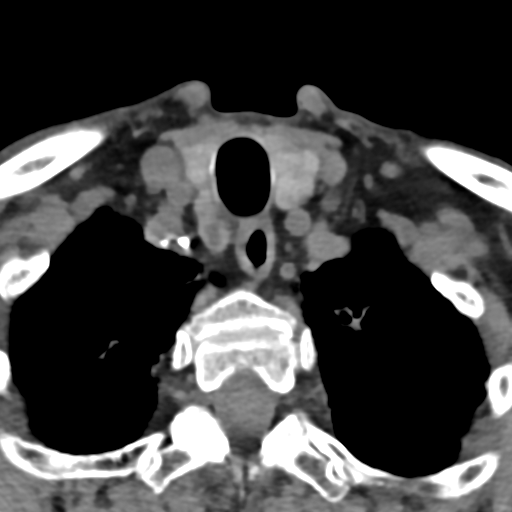
[im 9/79  bone]
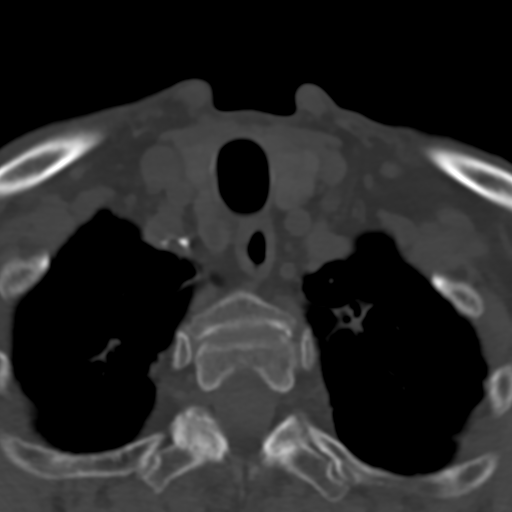
[im 27/79  bone]
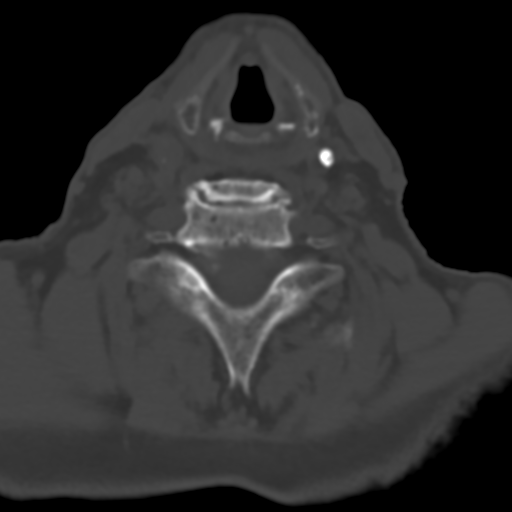
[im 35/79  bone]
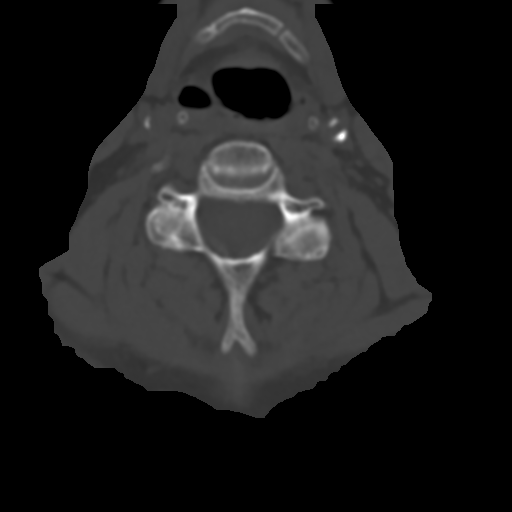
[im 44/79  bone]
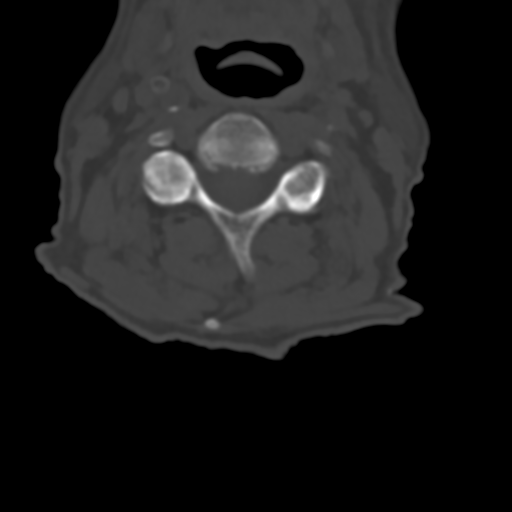
[im 61/79  soft-tissue]
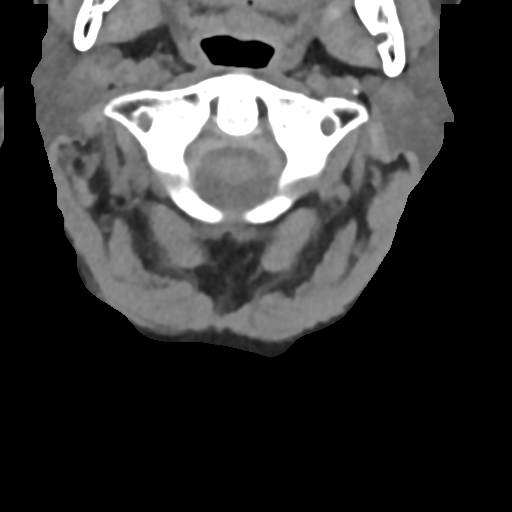
[im 61/79  bone]
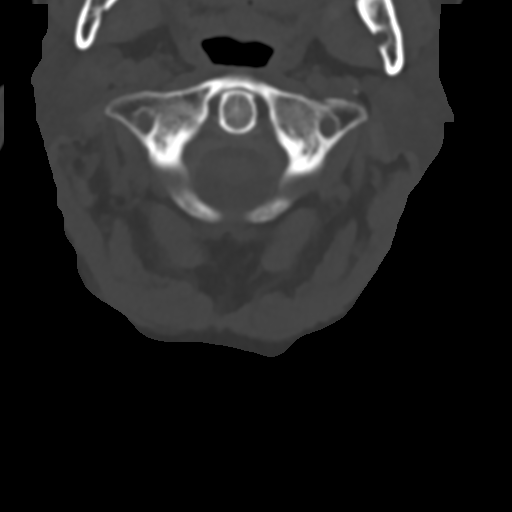
[im 70/79  bone]
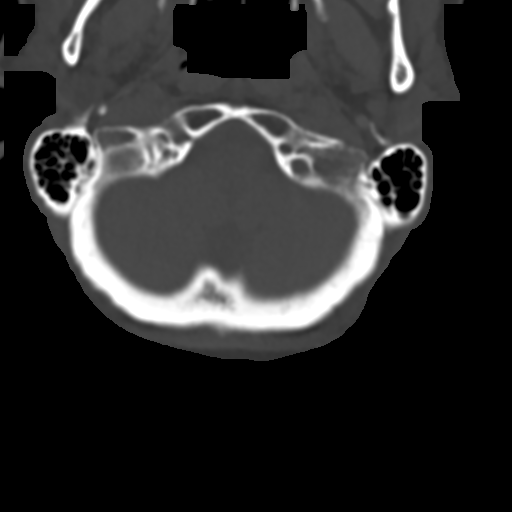

[Series 11: sagittal bone · sagittal · 0.23mm/px · 4 of 61 slices shown]
[im 13/61  bone]
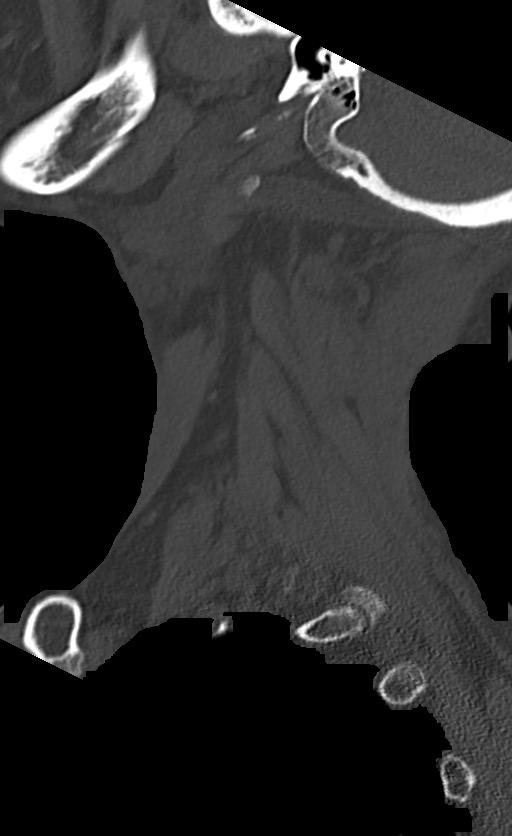
[im 25/61  bone]
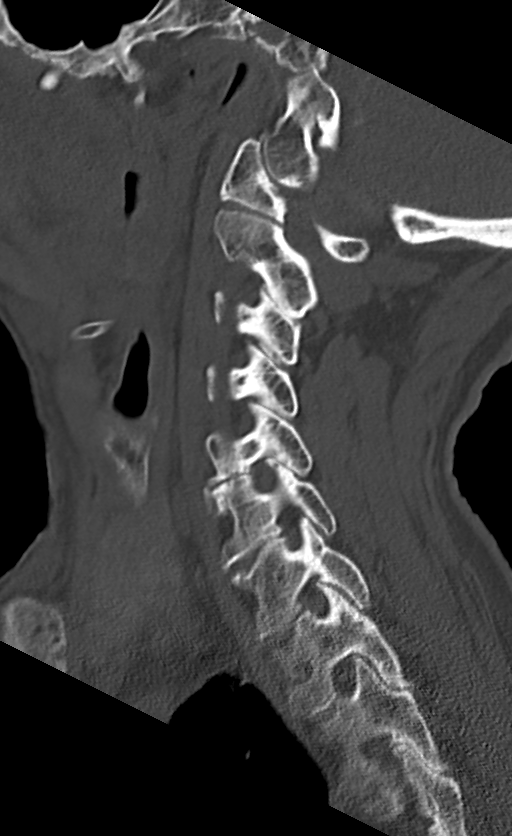
[im 37/61  bone]
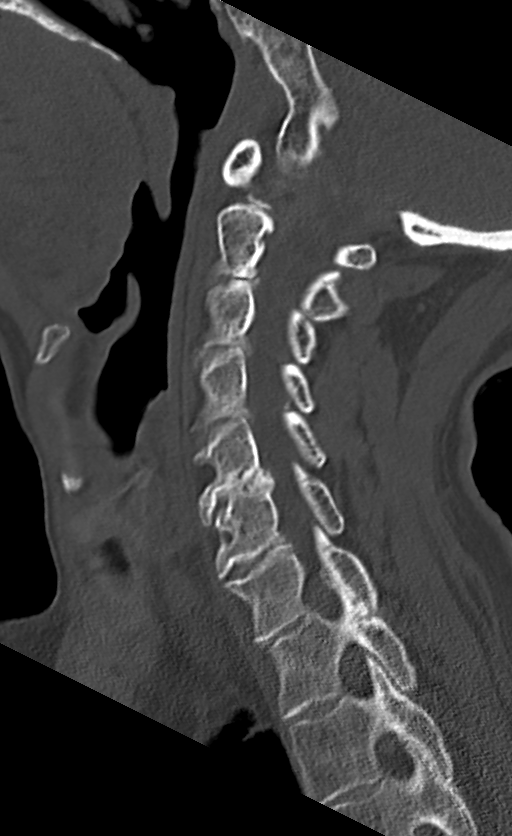
[im 49/61  bone]
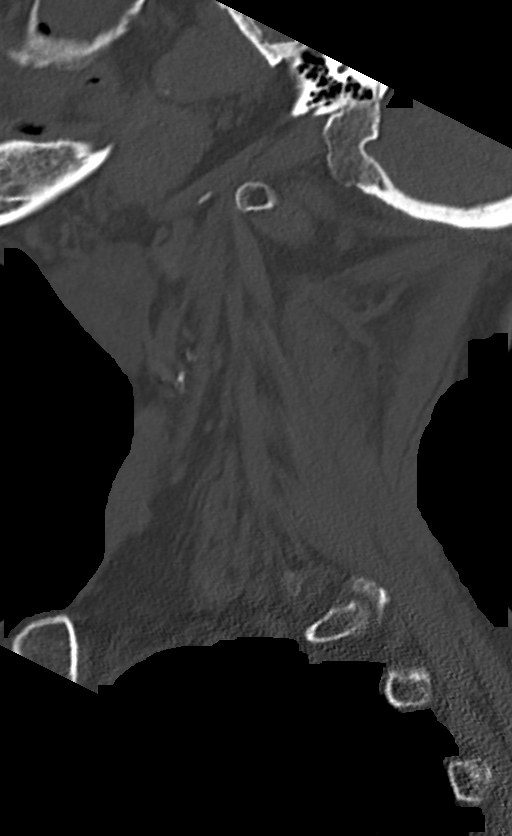

[13 of 33 positions shown; findings below may reference images not displayed]

FINDINGS: CT HEAD FINDINGS

Brain: No evidence of acute infarction, hemorrhage, hydrocephalus,
extra-axial collection or mass lesion/mass effect. Patchy white
matter hypoattenuation, nonspecific but compatible with chronic
microvascular ischemic disease. Mild for age atrophy. Partially
empty and expanded sella.

Vascular: Calcific atherosclerosis.

Skull: Left periorbital contusion.  No visible fracture.

Sinuses/Orbits: Mild paranasal sinus mucosal thickening. Left
maxillary sinus retention cyst.

Other: No mastoid effusions.

CT CERVICAL SPINE FINDINGS

Alignment: No substantial sagittal subluxation.

Skull base and vertebrae: Vertebral body heights are maintained. No
evidence of acute fracture.

Soft tissues and spinal canal: No prevertebral fluid or swelling. No
visible canal hematoma.

Disc levels: Multilevel degenerative disc disease, greatest at C5-C6
and C6-C7 with disc height loss, endplate sclerosis and posterior
disc/osteophyte complexes. Multilevel facet and uncovertebral
hypertrophy with resulting varying degrees of neural foraminal
stenosis.

Upper chest: Biapical pleuroparenchymal scarring.  Emphysema.

Other: Partially imaged right posterior thyroid nodule, measuring
1.7 cm. Bulky calcific atherosclerosis at bilateral carotid
bifurcations.
IMPRESSION: CT head:

1. No evidence of acute intracranial abnormality.
2. Chronic microvascular disease.

CT cervical spine:

1. No evidence of acute fracture or traumatic malalignment.
2. Moderate multilevel degenerative change.
3. Bulky calcific atherosclerosis at bilateral carotid bifurcations.
Carotid ultrasound or CTA neck could evaluate for potentially
significant stenosis if clinically indicated.
4. Partially imaged right posterior thyroid nodule measuring 1.7 cm.
Recommend thyroid US (ref: [HOSPITAL]. [DATE]):

## 2024-03-01 DIAGNOSIS — E041 Nontoxic single thyroid nodule: Secondary | ICD-10-CM | POA: Diagnosis not present

## 2024-03-01 DIAGNOSIS — R911 Solitary pulmonary nodule: Secondary | ICD-10-CM | POA: Diagnosis not present

## 2024-03-01 DIAGNOSIS — Z0000 Encounter for general adult medical examination without abnormal findings: Secondary | ICD-10-CM | POA: Diagnosis not present

## 2024-03-01 DIAGNOSIS — R296 Repeated falls: Secondary | ICD-10-CM | POA: Diagnosis not present

## 2024-03-01 DIAGNOSIS — I6523 Occlusion and stenosis of bilateral carotid arteries: Secondary | ICD-10-CM | POA: Diagnosis not present

## 2024-03-01 DIAGNOSIS — E1165 Type 2 diabetes mellitus with hyperglycemia: Secondary | ICD-10-CM | POA: Diagnosis not present

## 2024-03-01 DIAGNOSIS — I5022 Chronic systolic (congestive) heart failure: Secondary | ICD-10-CM | POA: Diagnosis not present

## 2024-03-01 DIAGNOSIS — E7800 Pure hypercholesterolemia, unspecified: Secondary | ICD-10-CM | POA: Diagnosis not present

## 2024-03-01 DIAGNOSIS — I10 Essential (primary) hypertension: Secondary | ICD-10-CM | POA: Diagnosis not present

## 2024-03-25 ENCOUNTER — Emergency Department (HOSPITAL_COMMUNITY)

## 2024-03-25 ENCOUNTER — Emergency Department (HOSPITAL_COMMUNITY)
Admission: EM | Admit: 2024-03-25 | Discharge: 2024-03-25 | Disposition: A | Discharge status: Home / Self Care | Attending: Emergency Medicine | Admitting: Emergency Medicine

## 2024-03-25 ENCOUNTER — Encounter (HOSPITAL_COMMUNITY): Payer: Self-pay

## 2024-03-25 DIAGNOSIS — R296 Repeated falls: Secondary | ICD-10-CM | POA: Diagnosis not present

## 2024-03-25 DIAGNOSIS — W1839XA Other fall on same level, initial encounter: Secondary | ICD-10-CM | POA: Diagnosis not present

## 2024-03-25 DIAGNOSIS — S32591A Other specified fracture of right pubis, initial encounter for closed fracture: Secondary | ICD-10-CM | POA: Insufficient documentation

## 2024-03-25 DIAGNOSIS — I700 Atherosclerosis of aorta: Secondary | ICD-10-CM | POA: Diagnosis not present

## 2024-03-25 DIAGNOSIS — Z7982 Long term (current) use of aspirin: Secondary | ICD-10-CM | POA: Insufficient documentation

## 2024-03-25 DIAGNOSIS — Z5329 Procedure and treatment not carried out because of patient's decision for other reasons: Secondary | ICD-10-CM | POA: Insufficient documentation

## 2024-03-25 DIAGNOSIS — Z79899 Other long term (current) drug therapy: Secondary | ICD-10-CM | POA: Insufficient documentation

## 2024-03-25 DIAGNOSIS — J9811 Atelectasis: Secondary | ICD-10-CM | POA: Diagnosis not present

## 2024-03-25 DIAGNOSIS — S0990XA Unspecified injury of head, initial encounter: Secondary | ICD-10-CM | POA: Diagnosis not present

## 2024-03-25 DIAGNOSIS — M160 Bilateral primary osteoarthritis of hip: Secondary | ICD-10-CM | POA: Diagnosis not present

## 2024-03-25 DIAGNOSIS — N1832 Chronic kidney disease, stage 3b: Secondary | ICD-10-CM | POA: Diagnosis not present

## 2024-03-25 DIAGNOSIS — I129 Hypertensive chronic kidney disease with stage 1 through stage 4 chronic kidney disease, or unspecified chronic kidney disease: Secondary | ICD-10-CM | POA: Diagnosis not present

## 2024-03-25 DIAGNOSIS — E1122 Type 2 diabetes mellitus with diabetic chronic kidney disease: Secondary | ICD-10-CM | POA: Diagnosis not present

## 2024-03-25 DIAGNOSIS — I10 Essential (primary) hypertension: Secondary | ICD-10-CM | POA: Diagnosis not present

## 2024-03-25 DIAGNOSIS — Z87891 Personal history of nicotine dependence: Secondary | ICD-10-CM | POA: Diagnosis not present

## 2024-03-25 DIAGNOSIS — M25551 Pain in right hip: Secondary | ICD-10-CM | POA: Diagnosis present

## 2024-03-25 DIAGNOSIS — S2231XA Fracture of one rib, right side, initial encounter for closed fracture: Secondary | ICD-10-CM | POA: Insufficient documentation

## 2024-03-25 LAB — CBC WITH DIFFERENTIAL/PLATELET
Abs Immature Granulocytes: 0.02 10*3/uL (ref 0.00–0.07)
Basophils Absolute: 0 10*3/uL (ref 0.0–0.1)
Basophils Relative: 0 %
Eosinophils Absolute: 0 10*3/uL (ref 0.0–0.5)
Eosinophils Relative: 1 %
HCT: 35.9 % — ABNORMAL LOW (ref 36.0–46.0)
Hemoglobin: 11.5 g/dL — ABNORMAL LOW (ref 12.0–15.0)
Immature Granulocytes: 0 %
Lymphocytes Relative: 24 %
Lymphs Abs: 1.2 10*3/uL (ref 0.7–4.0)
MCH: 31.4 pg (ref 26.0–34.0)
MCHC: 32 g/dL (ref 30.0–36.0)
MCV: 98.1 fL (ref 80.0–100.0)
Monocytes Absolute: 0.4 10*3/uL (ref 0.1–1.0)
Monocytes Relative: 7 %
Neutro Abs: 3.5 10*3/uL (ref 1.7–7.7)
Neutrophils Relative %: 68 %
Platelets: 158 10*3/uL (ref 150–400)
RBC: 3.66 MIL/uL — ABNORMAL LOW (ref 3.87–5.11)
RDW: 12.2 % (ref 11.5–15.5)
WBC: 5.1 10*3/uL (ref 4.0–10.5)
nRBC: 0 % (ref 0.0–0.2)

## 2024-03-25 LAB — COMPREHENSIVE METABOLIC PANEL WITH GFR
ALT: 19 U/L (ref 0–44)
AST: 20 U/L (ref 15–41)
Albumin: 4.4 g/dL (ref 3.5–5.0)
Alkaline Phosphatase: 114 U/L (ref 38–126)
Anion gap: 8 (ref 5–15)
BUN: 32 mg/dL — ABNORMAL HIGH (ref 8–23)
CO2: 21 mmol/L — ABNORMAL LOW (ref 22–32)
Calcium: 11.2 mg/dL — ABNORMAL HIGH (ref 8.9–10.3)
Chloride: 109 mmol/L (ref 98–111)
Creatinine, Ser: 1.44 mg/dL — ABNORMAL HIGH (ref 0.44–1.00)
GFR, Estimated: 36 mL/min — ABNORMAL LOW (ref >60)
Glucose, Bld: 144 mg/dL — ABNORMAL HIGH (ref 70–99)
Potassium: 3.9 mmol/L (ref 3.5–5.1)
Sodium: 138 mmol/L (ref 135–145)
Total Bilirubin: 0.7 mg/dL (ref 0.0–1.2)
Total Protein: 7.5 g/dL (ref 6.5–8.1)

## 2024-03-25 LAB — URINALYSIS, W/ REFLEX TO CULTURE (INFECTION SUSPECTED)
Bilirubin Urine: NEGATIVE
Glucose, UA: NEGATIVE mg/dL
Hgb urine dipstick: NEGATIVE
Ketones, ur: NEGATIVE mg/dL
Leukocytes,Ua: NEGATIVE
Nitrite: NEGATIVE
Protein, ur: NEGATIVE mg/dL
Specific Gravity, Urine: 1.012 (ref 1.005–1.030)
pH: 5 (ref 5.0–8.0)

## 2024-03-25 LAB — PROTIME-INR
INR: 1 (ref 0.8–1.2)
Prothrombin Time: 13.1 s (ref 11.4–15.2)

## 2024-03-25 LAB — RESP PANEL BY RT-PCR (RSV, FLU A&B, COVID)  RVPGX2
Influenza A by PCR: NEGATIVE
Influenza B by PCR: NEGATIVE
Resp Syncytial Virus by PCR: NEGATIVE
SARS Coronavirus 2 by RT PCR: NEGATIVE

## 2024-03-25 LAB — CBG MONITORING, ED: Glucose-Capillary: 153 mg/dL — ABNORMAL HIGH (ref 70–99)

## 2024-03-25 MED ORDER — ACETAMINOPHEN 500 MG PO TABS
1000.0000 mg | ORAL_TABLET | Freq: Once | ORAL | Status: AC
  Administered 2024-03-25: 1000 mg via ORAL
  Filled 2024-03-25: qty 2

## 2024-03-25 MED ORDER — OXYCODONE HCL 5 MG PO TABS
5.0000 mg | ORAL_TABLET | Freq: Once | ORAL | Status: AC
  Administered 2024-03-25: 5 mg via ORAL
  Filled 2024-03-25: qty 1

## 2024-03-25 NOTE — ED Provider Notes (Signed)
  EMERGENCY DEPARTMENT AT [facility] Provider Note   CSN: [id number] Arrival date & time: 03/25/24  1810     History  Chief Complaint  Patient presents with   Rudolfo Berry multiple times for the past few days. Pain in her right side hip area.     Kristin Berry is a 83 y.o. female with PMH as listed below who presents BIB her grandson, states that she's been falling more than usual. States she fell 3 times in the past few days, and has balance issues. Pt. Is experiencing pain on the right side hip area post fall. Doesn't know what made her fall more but she had had increased falls for the last several days. Denies headaches, blurred vision, chest pain, SOB, cough, F/c, abd pain, N/V/d/C, urinary sxs, numbness/tingling, asymmetric weakness. Has hit her head but doesn't take blood thinner. Endorses pain in her right hip but has been walking on it. Also w/ severe pain in the right flank which hurts with inspiration. No recent medication changes.  Past Medical History:  Diagnosis Date   Diabetes mellitus type 2 in nonobese (HCC)    Dyslipidemia    Hypertension    Stage 3b chronic kidney disease (CKD) (HCC)    Tobacco dependence        Home Medications Prior to Admission medications   Medication Sig Start Date End Date Taking? Authorizing Provider  amLODipine  (NORVASC ) 10 MG tablet Take 10 mg by mouth daily. 12/04/21   [provider]  aspirin  81 MG chewable tablet Chew 81 mg by mouth daily.    [provider]  atenolol  (TENORMIN ) 50 MG tablet Take 50 mg by mouth 2 (two) times daily. 12/04/21   [provider]  atorvastatin  (LIPITOR) 40 MG tablet Take 40 mg by mouth daily. 12/04/21   [provider]  carboxymethylcellulose (REFRESH PLUS) 0.5 % SOLN 1 drop 3 (three) times daily as needed (dry eyes).    [provider]  cholecalciferol (VITAMIN D3) 25 MCG (1000 UNIT) tablet Take 1,000 Units by mouth daily.    [provider]  docusate sodium  (COLACE) 100 MG capsule Take 100 mg by mouth 2 (two) times daily as needed for mild constipation.    [provider]  HYDROcodone-acetaminophen  (NORCO) 10-325 MG tablet Take 1 tablet by mouth every 6 (six) hours as needed for pain. 12/18/21   [provider]      Allergies    Codeine and Tramadol hcl    Review of Systems   Review of Systems A 10 point review of systems was performed and is negative unless otherwise reported in HPI.  Physical Exam Updated Vital Signs BP 122/63   Pulse 62   Temp 97.8 F (36.6 C) (Oral)   Resp 17   Ht 5\' 2"  (1.575 m)   Wt 45.4 kg   SpO2 97%   BMI 18.29 kg/m  Physical Exam General: Normal appearing elderly female, lying in bed.  HEENT: NCAT.  No facial trauma.  PERRLA, Sclera anicteric, MMM, trachea midline.  Cardiology: RRR, no murmurs/rubs/gallops. BL radial and DP pulses equal bilaterally. Tenderness to palpation in the right lower lateral ribs without any crepitus palpated Resp: Normal respiratory rate and effort. CTAB, no wheezes, rhonchi, crackles.  Abd: Soft, non-tender, non-distended. No rebound tenderness or guarding.  Pelvis: Pelvis stable and nontender. MSK: No peripheral edema or signs of trauma. Extremities without deformity or TTP.  Full range of motion in the bilateral  lower extremities.  Compartments soft.  No cyanosis or clubbing. Skin: warm, dry. No rashes or lesions. Back: No midline CT or L-spine tenderness with patient step-offs or deformities. Neuro: A&Ox4, CNs II-XII grossly intact. MAEs. Sensation grossly intact.  Psych: Normal mood and affect.   ED Results / Procedures / Treatments   Labs (all labs ordered are listed, but only abnormal results are displayed) Labs Reviewed  COMPREHENSIVE METABOLIC PANEL WITH GFR - Abnormal; Notable for the following components:      Result Value   CO2 21 (*)    Glucose, Bld 144 (*)    BUN 32 (*)    Creatinine, Ser 1.44 (*)    Calcium   11.2 (*)    GFR, Estimated 36 (*)    All other components within normal limits  URINALYSIS, W/ REFLEX TO CULTURE (INFECTION SUSPECTED) - Abnormal; Notable for the following components:   Color, Urine STRAW (*)    Bacteria, UA RARE (*)    All other components within normal limits  CBC WITH DIFFERENTIAL/PLATELET - Abnormal; Notable for the following components:   RBC 3.66 (*)    Hemoglobin 11.5 (*)    HCT 35.9 (*)    All other components within normal limits  CBG MONITORING, ED - Abnormal; Notable for the following components:   Glucose-Capillary 153 (*)    All other components within normal limits  RESP PANEL BY RT-PCR (RSV, FLU A&B, COVID)  RVPGX2  PROTIME-INR    EKG EKG Interpretation Date/Time:  Saturday Mar 25 2024 20:43:51 EDT Ventricular Rate:  57 PR Interval:  183 QRS Duration:  99 QT Interval:  409 QTC Calculation: 399 R Axis:   11  Text Interpretation: Sinus rhythm Confirmed by Annita Kindle [id number] on 03/25/2024 9:20:57 PM  Radiology CT Head Wo Contrast Result Date: 03/25/2024 CLINICAL DATA:  Head trauma, minor (Age >= 65y) EXAM: CT HEAD WITHOUT CONTRAST TECHNIQUE: Contiguous axial images were obtained from the base of the skull through the vertex without intravenous contrast. RADIATION DOSE REDUCTION: This exam was performed according to the departmental dose-optimization program which includes automated exposure control, adjustment of the mA and/or kV according to patient size and/or use of iterative reconstruction technique. COMPARISON:  CT head February 10, 23. FINDINGS: Brain: No evidence of acute infarction, hemorrhage, hydrocephalus, extra-axial collection or mass lesion/mass effect. Vascular: No hyperdense vessel. Skull: No acute fracture. Sinuses/Orbits: No acute orbital findings.  Clear sinuses. Other: No mastoid effusions. IMPRESSION: No evidence of acute intracranial abnormality. Electronically Signed   By: Stevenson Elbe M.D.   On: 03/25/2024 20:19   DG Pelvis  Portable Result Date: 03/25/2024 CLINICAL DATA:  Fall, right hip pain EXAM: PORTABLE PELVIS 1-2 VIEWS COMPARISON:  None Available. FINDINGS: Degenerative changes in the hips bilaterally, left greater than right. No proximal femoral fracture. There is a fracture through the right inferior pubic ramus. No subluxation or dislocation. IMPRESSION: Right inferior pubic ramus fracture. Electronically Signed   By: Janeece Mechanic M.D.   On: 03/25/2024 20:08   DG Ribs Unilateral W/Chest Right Result Date: 03/25/2024 CLINICAL DATA:  Right rib pain.  Fall. EXAM: RIGHT RIBS AND CHEST - 3+ VIEW COMPARISON:  08/21/2022 FINDINGS: Lateral right 9th rib fracture. No associated effusion or pneumothorax. Minimal right base atelectasis. Heart mediastinal contours within normal limits. Aortic atherosclerosis. IMPRESSION: Lateral right 9th rib fracture. No associated effusion or pneumothorax. Right base atelectasis. Electronically Signed   By: Janeece Mechanic M.D.   On: 03/25/2024 20:07    Procedures Procedures  Medications Ordered in ED Medications  acetaminophen  (TYLENOL ) tablet 1,000 mg (1,000 mg Oral Given 03/25/24 2133)  oxyCODONE  (Oxy IR/ROXICODONE ) immediate release tablet 5 mg (5 mg Oral Given 03/25/24 2132)    ED Course/ Medical Decision Making/ A&P                          Medical Decision Making Amount and/or Complexity of Data Reviewed Labs: ordered. Decision-making details documented in ED Course. Radiology: ordered. Decision-making details documented in ED Course.  Risk OTC drugs. Prescription drug management.    This patient presents to the ED for concern of recurrent falls, chest/hip pain, this involves an extensive number of treatment options, and is a complaint that carries with it a high risk of complications and morbidity.  I considered the following differential and admission for this acute, potentially life threatening condition.   MDM:    DDX for trauma includes but is not limited  to:  -Head Injury such as skull fx or ICH -did hit her head did not lose consciousness.  Given age will get CT head -Chest Injury and Abdominal Injury - greatest concern for rib fractures given inspiratory pain in the right lateral ribs - No vertebral tenderness on palpation -Fractures -has full range of motion in the right hip, lower concern for hip fracture but must consider pelvic fracture.  She has been ambulatory.  NVI, compartments soft  -For recurrent falls, she has no electrolyte derangements or significant anemia.  Creatinine is at baseline.  Afebrile, no leukocytosis, UA without signs of infection.  Respiratory panel is negative for viral illness.  Glucose is 153.  Patient does live alone and her grandchildren check on her but cannot be with her all the time.  She does not have any stairs in her home and lives in a small trailer, grandson states that they have done this to try to minimize her risk for falls.  He request information about social work so they can discuss someone coming out to visit her more frequently.  Clinical Course as of 03/25/24 2318  Sat Mar 25, 2024  2031 CT Head Wo Contrast No evidence of acute intracranial abnormality. [HN]  2031 DG Pelvis Portable Right inferior pubic ramus fracture. [HN]  2031 DG Ribs Unilateral W/Chest Right Lateral right 9th rib fracture. No associated effusion or pneumothorax.  Right base atelectasis.   [HN]  2125 D/w orthopedics Dr. Hermina Loosen who recommends weight bearing as tolerated for the inferior pubic ramus fx [HN]  2245 Urinalysis, w/ Reflex to Culture (Infection Suspected) -Urine, Clean Catch(!) Neg for infection [HN]  2310 Discussed extensively with the patient and her grandson at bedside.  Patient states she would like to be discharged home because she has "things to take care of."  Kristin Berry thinks the patient wants to go because she wants to smoke cigarettes.  I offered patient nicotine  patch but she declines.  Patient states  that she would like to be discharged.  She lives alone.  With patient's recurrent falls, several fractures, and living alone, I would rather that patient be admitted for pain management and workup for recurrent falls or rehab placement.  Patient declines.  I informed patient of risks of discharge today and recurrent falls up to and including death and she reports understanding and accepts these risks.  Patient does have a cane and a walker at home.  I recommend walking with the walker weightbearing as tolerated and Tylenol  for pain control.  Also  recommended incentive spirometry every hour while awake for the rib fracture and close follow-up with a primary care physician.  Will be discharged AGAINST MEDICAL ADVICE. [HN]    Clinical Course User Index [HN] Merdis Stalling, MD    Labs: I Ordered, and personally interpreted labs.  The pertinent results include: Those listed above  Imaging Studies ordered: I ordered imaging studies including CT head, chest x-ray, pelvis x-ray I independently visualized and interpreted imaging. I agree with the radiologist interpretation  Additional history obtained from chart review, grandson at bedside  Reevaluation: After the interventions noted above, I reevaluated the patient and found that they have :improved  Social Determinants of Health:  lives independently  Disposition: AMA  Co morbidities that complicate the patient evaluation  Past Medical History:  Diagnosis Date   Diabetes mellitus type 2 in nonobese (HCC)    Dyslipidemia    Hypertension    Stage 3b chronic kidney disease (CKD) (HCC)    Tobacco dependence      Medicines Meds ordered this encounter  Medications   acetaminophen  (TYLENOL ) tablet 1,000 mg   oxyCODONE  (Oxy IR/ROXICODONE ) immediate release tablet 5 mg    Refill:  0    I have reviewed the patients home medicines and have made adjustments as needed  Problem List / ED Course: Problem List Items Addressed This Visit    None Visit Diagnoses       Recurrent falls    -  Primary     Closed fracture of one rib of right side, initial encounter         Closed fracture of right inferior pubic ramus, initial encounter (HCC)                       This note was created using dictation software, which may contain spelling or grammatical errors.    Merdis Stalling, MD 03/25/24 [phone]

## 2024-03-25 NOTE — ED Notes (Signed)
 Pt ambulated in hallway

## 2024-03-25 NOTE — Discharge Instructions (Addendum)
 Thank you for coming to [facility] Emergency Department. You were seen for falls, flank pain, hip pain. We did an exam, labs, and imaging, and these showed: -Right 9th rib fracture -Right inferior pubic ramus fracture (pelvic fracture)  We wanted to admit you to the hospital but you preferred to be discharged AGAINST MEDICAL ADVICE.  You prefer to go home.  You understand the risks of recurrent falls up to and including death.  -For your pelvic fracture you can weight-bear as tolerated.  Please use your walker at home.  You can follow-up with Dr. Hermina Loosen in clinic within the next 1 to 2 weeks, please call his office on Monday morning to make a follow-up appointment. -For your rib fracture, the biggest risk is pneumonia.  Please use the incentive spirometer once per hour every hour while awake. -You can take Tylenol  1,000 mg every 8 hours for pain. Please follow up with your primary care provider within 1 week.   Do not hesitate to return to the ED or call 911 if you experience: -Worsening symptoms -Chest pain, shortness of breath -Lightheadedness, passing out -Fevers/chills -Anything else that concerns you

## 2024-03-25 NOTE — ED Triage Notes (Signed)
 Pt BIB her grandson, states that she's been falling more than usual. States she fell 3 times in the past few days, and has balance issues. Pt. Is experiencing pain on the right side hip area post fall. Has vision issues such as double vision and blurred vision.

## 2024-03-31 ENCOUNTER — Inpatient Hospital Stay (HOSPITAL_COMMUNITY)
Admission: EM | Admit: 2024-03-31 | Discharge: 2024-04-06 | DRG: 682 | Disposition: A | Discharge status: Skilled Nursing Facility | Attending: Internal Medicine | Admitting: Internal Medicine

## 2024-03-31 ENCOUNTER — Encounter (HOSPITAL_COMMUNITY): Payer: Self-pay

## 2024-03-31 ENCOUNTER — Emergency Department (HOSPITAL_COMMUNITY)

## 2024-03-31 ENCOUNTER — Other Ambulatory Visit: Payer: Self-pay

## 2024-03-31 DIAGNOSIS — Z79899 Other long term (current) drug therapy: Secondary | ICD-10-CM | POA: Diagnosis not present

## 2024-03-31 DIAGNOSIS — G9341 Metabolic encephalopathy: Secondary | ICD-10-CM | POA: Diagnosis present

## 2024-03-31 DIAGNOSIS — I10 Essential (primary) hypertension: Secondary | ICD-10-CM | POA: Diagnosis not present

## 2024-03-31 DIAGNOSIS — S2241XA Multiple fractures of ribs, right side, initial encounter for closed fracture: Secondary | ICD-10-CM | POA: Diagnosis not present

## 2024-03-31 DIAGNOSIS — E785 Hyperlipidemia, unspecified: Secondary | ICD-10-CM | POA: Diagnosis not present

## 2024-03-31 DIAGNOSIS — W19XXXA Unspecified fall, initial encounter: Secondary | ICD-10-CM | POA: Diagnosis present

## 2024-03-31 DIAGNOSIS — M4807 Spinal stenosis, lumbosacral region: Secondary | ICD-10-CM | POA: Diagnosis not present

## 2024-03-31 DIAGNOSIS — S32591S Other specified fracture of right pubis, sequela: Secondary | ICD-10-CM

## 2024-03-31 DIAGNOSIS — Z743 Need for continuous supervision: Secondary | ICD-10-CM | POA: Diagnosis not present

## 2024-03-31 DIAGNOSIS — K7689 Other specified diseases of liver: Secondary | ICD-10-CM | POA: Diagnosis not present

## 2024-03-31 DIAGNOSIS — R410 Disorientation, unspecified: Secondary | ICD-10-CM

## 2024-03-31 DIAGNOSIS — S32591A Other specified fracture of right pubis, initial encounter for closed fracture: Secondary | ICD-10-CM | POA: Diagnosis not present

## 2024-03-31 DIAGNOSIS — E119 Type 2 diabetes mellitus without complications: Secondary | ICD-10-CM | POA: Diagnosis not present

## 2024-03-31 DIAGNOSIS — I6782 Cerebral ischemia: Secondary | ICD-10-CM | POA: Diagnosis not present

## 2024-03-31 DIAGNOSIS — Z751 Person awaiting admission to adequate facility elsewhere: Secondary | ICD-10-CM

## 2024-03-31 DIAGNOSIS — G8929 Other chronic pain: Secondary | ICD-10-CM | POA: Diagnosis not present

## 2024-03-31 DIAGNOSIS — I129 Hypertensive chronic kidney disease with stage 1 through stage 4 chronic kidney disease, or unspecified chronic kidney disease: Secondary | ICD-10-CM | POA: Diagnosis not present

## 2024-03-31 DIAGNOSIS — M6281 Muscle weakness (generalized): Secondary | ICD-10-CM | POA: Diagnosis not present

## 2024-03-31 DIAGNOSIS — Z885 Allergy status to narcotic agent status: Secondary | ICD-10-CM | POA: Diagnosis not present

## 2024-03-31 DIAGNOSIS — R001 Bradycardia, unspecified: Secondary | ICD-10-CM | POA: Diagnosis not present

## 2024-03-31 DIAGNOSIS — N189 Chronic kidney disease, unspecified: Secondary | ICD-10-CM | POA: Diagnosis not present

## 2024-03-31 DIAGNOSIS — R296 Repeated falls: Secondary | ICD-10-CM | POA: Diagnosis not present

## 2024-03-31 DIAGNOSIS — N179 Acute kidney failure, unspecified: Secondary | ICD-10-CM | POA: Diagnosis present

## 2024-03-31 DIAGNOSIS — M25559 Pain in unspecified hip: Secondary | ICD-10-CM | POA: Diagnosis not present

## 2024-03-31 DIAGNOSIS — E872 Acidosis, unspecified: Secondary | ICD-10-CM | POA: Insufficient documentation

## 2024-03-31 DIAGNOSIS — R1313 Dysphagia, pharyngeal phase: Secondary | ICD-10-CM | POA: Diagnosis not present

## 2024-03-31 DIAGNOSIS — R262 Difficulty in walking, not elsewhere classified: Secondary | ICD-10-CM | POA: Diagnosis not present

## 2024-03-31 DIAGNOSIS — Z7982 Long term (current) use of aspirin: Secondary | ICD-10-CM

## 2024-03-31 DIAGNOSIS — Z7401 Bed confinement status: Secondary | ICD-10-CM | POA: Diagnosis not present

## 2024-03-31 DIAGNOSIS — N171 Acute kidney failure with acute cortical necrosis: Secondary | ICD-10-CM | POA: Diagnosis not present

## 2024-03-31 DIAGNOSIS — E7849 Other hyperlipidemia: Secondary | ICD-10-CM

## 2024-03-31 DIAGNOSIS — F17210 Nicotine dependence, cigarettes, uncomplicated: Secondary | ICD-10-CM | POA: Diagnosis not present

## 2024-03-31 DIAGNOSIS — S72009D Fracture of unspecified part of neck of unspecified femur, subsequent encounter for closed fracture with routine healing: Secondary | ICD-10-CM

## 2024-03-31 DIAGNOSIS — S2249XA Multiple fractures of ribs, unspecified side, initial encounter for closed fracture: Secondary | ICD-10-CM | POA: Insufficient documentation

## 2024-03-31 DIAGNOSIS — N184 Chronic kidney disease, stage 4 (severe): Secondary | ICD-10-CM | POA: Diagnosis not present

## 2024-03-31 DIAGNOSIS — M47816 Spondylosis without myelopathy or radiculopathy, lumbar region: Secondary | ICD-10-CM | POA: Diagnosis not present

## 2024-03-31 DIAGNOSIS — E1122 Type 2 diabetes mellitus with diabetic chronic kidney disease: Secondary | ICD-10-CM | POA: Diagnosis not present

## 2024-03-31 LAB — URINALYSIS, ROUTINE W REFLEX MICROSCOPIC
Bilirubin Urine: NEGATIVE
Glucose, UA: NEGATIVE mg/dL
Hgb urine dipstick: NEGATIVE
Ketones, ur: 5 mg/dL — AB
Leukocytes,Ua: NEGATIVE
Nitrite: NEGATIVE
Protein, ur: NEGATIVE mg/dL
Specific Gravity, Urine: 1.02 (ref 1.005–1.030)
pH: 5 (ref 5.0–8.0)

## 2024-03-31 LAB — BASIC METABOLIC PANEL WITH GFR
Anion gap: 12 (ref 5–15)
BUN: 73 mg/dL — ABNORMAL HIGH (ref 8–23)
CO2: 12 mmol/L — ABNORMAL LOW (ref 22–32)
Calcium: 10.6 mg/dL — ABNORMAL HIGH (ref 8.9–10.3)
Chloride: 112 mmol/L — ABNORMAL HIGH (ref 98–111)
Creatinine, Ser: 2.25 mg/dL — ABNORMAL HIGH (ref 0.44–1.00)
GFR, Estimated: 21 mL/min — ABNORMAL LOW (ref >60)
Glucose, Bld: 129 mg/dL — ABNORMAL HIGH (ref 70–99)
Potassium: 4.5 mmol/L (ref 3.5–5.1)
Sodium: 136 mmol/L (ref 135–145)

## 2024-03-31 LAB — CBC WITH DIFFERENTIAL/PLATELET
Abs Immature Granulocytes: 0.03 10*3/uL (ref 0.00–0.07)
Basophils Absolute: 0 10*3/uL (ref 0.0–0.1)
Basophils Relative: 0 %
Eosinophils Absolute: 0 10*3/uL (ref 0.0–0.5)
Eosinophils Relative: 0 %
HCT: 36.1 % (ref 36.0–46.0)
Hemoglobin: 11.9 g/dL — ABNORMAL LOW (ref 12.0–15.0)
Immature Granulocytes: 0 %
Lymphocytes Relative: 17 %
Lymphs Abs: 1.2 10*3/uL (ref 0.7–4.0)
MCH: 31.9 pg (ref 26.0–34.0)
MCHC: 33 g/dL (ref 30.0–36.0)
MCV: 96.8 fL (ref 80.0–100.0)
Monocytes Absolute: 0.3 10*3/uL (ref 0.1–1.0)
Monocytes Relative: 5 %
Neutro Abs: 5.4 10*3/uL (ref 1.7–7.7)
Neutrophils Relative %: 78 %
Platelets: 218 10*3/uL (ref 150–400)
RBC: 3.73 MIL/uL — ABNORMAL LOW (ref 3.87–5.11)
RDW: 12.9 % (ref 11.5–15.5)
WBC: 6.9 10*3/uL (ref 4.0–10.5)
nRBC: 0 % (ref 0.0–0.2)

## 2024-03-31 LAB — NA AND K (SODIUM & POTASSIUM), RAND UR
Potassium Urine: 24 mmol/L
Sodium, Ur: 58 mmol/L

## 2024-03-31 LAB — GLUCOSE, CAPILLARY: Glucose-Capillary: 108 mg/dL — ABNORMAL HIGH (ref 70–99)

## 2024-03-31 LAB — CREATININE, URINE, RANDOM: Creatinine, Urine: 69 mg/dL

## 2024-03-31 MED ORDER — OYSTER SHELL CALCIUM/D3 500-5 MG-MCG PO TABS
1.0000 | ORAL_TABLET | Freq: Two times a day (BID) | ORAL | Status: DC
  Administered 2024-03-31 – 2024-04-04 (×8): 1 via ORAL
  Filled 2024-03-31 (×8): qty 1

## 2024-03-31 MED ORDER — SODIUM CHLORIDE 0.9% FLUSH
3.0000 mL | Freq: Two times a day (BID) | INTRAVENOUS | Status: DC
  Administered 2024-03-31 – 2024-04-05 (×9): 3 mL via INTRAVENOUS

## 2024-03-31 MED ORDER — DOCUSATE SODIUM 100 MG PO CAPS
100.0000 mg | ORAL_CAPSULE | Freq: Two times a day (BID) | ORAL | Status: DC | PRN
  Administered 2024-04-03: 100 mg via ORAL
  Filled 2024-03-31 (×2): qty 1

## 2024-03-31 MED ORDER — SODIUM BICARBONATE 650 MG PO TABS
650.0000 mg | ORAL_TABLET | Freq: Two times a day (BID) | ORAL | Status: DC
  Administered 2024-03-31 – 2024-04-05 (×10): 650 mg via ORAL
  Filled 2024-03-31 (×10): qty 1

## 2024-03-31 MED ORDER — ACETAMINOPHEN 650 MG RE SUPP: 650.0000 mg | Freq: Four times a day (QID) | RECTAL | Status: DC | PRN

## 2024-03-31 MED ORDER — ATORVASTATIN CALCIUM 40 MG PO TABS
40.0000 mg | ORAL_TABLET | Freq: Every day | ORAL | Status: DC
  Administered 2024-04-01 – 2024-04-06 (×6): 40 mg via ORAL
  Filled 2024-03-31 (×6): qty 1

## 2024-03-31 MED ORDER — LACTATED RINGERS IV SOLN: INTRAVENOUS | Status: AC

## 2024-03-31 MED ORDER — ASPIRIN 81 MG PO CHEW
81.0000 mg | CHEWABLE_TABLET | Freq: Every day | ORAL | Status: DC
  Administered 2024-04-01 – 2024-04-06 (×6): 81 mg via ORAL
  Filled 2024-03-31 (×6): qty 1

## 2024-03-31 MED ORDER — SODIUM CHLORIDE 0.9 % IV BOLUS
1000.0000 mL | Freq: Once | INTRAVENOUS | Status: AC
  Administered 2024-03-31: 1000 mL via INTRAVENOUS

## 2024-03-31 MED ORDER — ONDANSETRON HCL 4 MG PO TABS: 4.0000 mg | ORAL_TABLET | Freq: Four times a day (QID) | ORAL | Status: DC | PRN

## 2024-03-31 MED ORDER — SODIUM CHLORIDE 0.9% FLUSH: 3.0000 mL | INTRAVENOUS | Status: DC | PRN

## 2024-03-31 MED ORDER — HYDROCODONE-ACETAMINOPHEN 10-325 MG PO TABS
1.0000 | ORAL_TABLET | Freq: Four times a day (QID) | ORAL | Status: DC | PRN
  Administered 2024-03-31 – 2024-04-05 (×4): 1 via ORAL
  Filled 2024-03-31 (×5): qty 1

## 2024-03-31 MED ORDER — ACETAMINOPHEN 325 MG PO TABS
650.0000 mg | ORAL_TABLET | Freq: Four times a day (QID) | ORAL | Status: DC | PRN
  Administered 2024-04-05 – 2024-04-06 (×2): 650 mg via ORAL
  Filled 2024-03-31 (×2): qty 2

## 2024-03-31 MED ORDER — SODIUM CHLORIDE 0.9% FLUSH
3.0000 mL | Freq: Two times a day (BID) | INTRAVENOUS | Status: DC
  Administered 2024-03-31 – 2024-04-06 (×9): 3 mL via INTRAVENOUS

## 2024-03-31 MED ORDER — NICOTINE 14 MG/24HR TD PT24
14.0000 mg | MEDICATED_PATCH | Freq: Every day | TRANSDERMAL | Status: DC
  Administered 2024-03-31 – 2024-04-06 (×7): 14 mg via TRANSDERMAL
  Filled 2024-03-31 (×7): qty 1

## 2024-03-31 MED ORDER — ONDANSETRON HCL 4 MG/2ML IJ SOLN
4.0000 mg | Freq: Four times a day (QID) | INTRAMUSCULAR | Status: DC | PRN
  Administered 2024-04-04: 4 mg via INTRAVENOUS
  Filled 2024-03-31: qty 2

## 2024-03-31 MED ORDER — SODIUM CHLORIDE 0.9 % IV SOLN: 250.0000 mL | INTRAVENOUS | Status: AC | PRN

## 2024-03-31 NOTE — ED Provider Notes (Signed)
 [place] [facility] Provider Note   CSN: [id number] Arrival date & time: 03/31/24  1401     History  Chief Complaint  Patient presents with   Kristin Berry is a 83 y.o. female presenting from home with worsening weakness and confusion.  Patient was seen in the hospital 6 days ago on March 3 after a fall, which time she was diagnosed with a rib fracture as well as a potential inferior pubic rami fracture.  She had a CT of her head at that time did not show any significant injuries.  She was recommended for admission but the patient left AGAINST MEDICAL ADVICE, citing concerns at this time about wanting to be able to smoke and cannot comply at home.  She is back with her grandson.  He reports that she had been living independently in a trailer next to a family member.  But the patient's been having worsening weakness, he is concerned he is not feeding herself or drinking water, and she has had recurring falls.  Patient appears confused and cannot provide an exact timeline for what been going on at home. She is not on blood thinning medicine.  She denies headache to me.  She says that her low back has been hurting worse than normal.  HPI     Home Medications Prior to Admission medications   Medication Sig Start Date End Date Taking? Authorizing Provider  amLODipine  (NORVASC ) 10 MG tablet Take 10 mg by mouth daily. 12/04/21   [provider]  aspirin  81 MG chewable tablet Chew 81 mg by mouth daily.    [provider]  atenolol  (TENORMIN ) 50 MG tablet Take 50 mg by mouth 2 (two) times daily. 12/04/21   [provider]  atorvastatin  (LIPITOR) 40 MG tablet Take 40 mg by mouth daily. 12/04/21   [provider]  carboxymethylcellulose (REFRESH PLUS) 0.5 % SOLN 1 drop 3 (three) times daily as needed (dry eyes).    [provider]  cholecalciferol (VITAMIN D3) 25 MCG (1000 UNIT) tablet Take 1,000 Units by  mouth daily.    [provider]  docusate sodium  (COLACE) 100 MG capsule Take 100 mg by mouth 2 (two) times daily as needed for mild constipation.    [provider]  HYDROcodone-acetaminophen  (NORCO) 10-325 MG tablet Take 1 tablet by mouth every 6 (six) hours as needed for pain. 12/18/21   [provider]      Allergies    Codeine and Tramadol hcl    Review of Systems   Review of Systems  Physical Exam Updated Vital Signs BP (!) 158/63 (BP Location: Right Arm)   Pulse 64   Temp 97.6 F (36.4 C) (Oral)   Resp 18   Ht 5\' 2"  (1.575 m)   Wt 45.4 kg   SpO2 100%   BMI 18.29 kg/m  Physical Exam Constitutional:      General: She is not in acute distress. HENT:     Head: Normocephalic and atraumatic.  Eyes:     Conjunctiva/sclera: Conjunctivae normal.     Pupils: Pupils are equal, round, and reactive to light.  Cardiovascular:     Rate and Rhythm: Normal rate and regular rhythm.  Pulmonary:     Effort: Pulmonary effort is normal. No respiratory distress.  Abdominal:     General: There is no distension.     Tenderness: There is no abdominal tenderness.  Musculoskeletal:     Comments:  Lumbar midline tenderness Pain with rotation and elevation of the left leg at the hip, flexion at the hip Tenderness of the chest wall unilaterally without crepitus  Skin:    General: Skin is warm and dry.  Neurological:     General: No focal deficit present.     Mental Status: She is alert. Mental status is at baseline.     ED Results / Procedures / Treatments   Labs (all labs ordered are listed, but only abnormal results are displayed) Labs Reviewed  URINALYSIS, ROUTINE W REFLEX MICROSCOPIC - Abnormal; Notable for the following components:      Result Value   APPearance HAZY (*)    Ketones, ur 5 (*)    All other components within normal limits  BASIC METABOLIC PANEL WITH GFR - Abnormal; Notable for the following components:   Chloride 112 (*)    CO2 12 (*)     Glucose, Bld 129 (*)    BUN 73 (*)    Creatinine, Ser 2.25 (*)    Calcium  10.6 (*)    GFR, Estimated 21 (*)    All other components within normal limits  CBC WITH DIFFERENTIAL/PLATELET - Abnormal; Notable for the following components:   RBC 3.73 (*)    Hemoglobin 11.9 (*)    All other components within normal limits  GLUCOSE, CAPILLARY - Abnormal; Notable for the following components:   Glucose-Capillary 108 (*)    All other components within normal limits  NA AND K (SODIUM & POTASSIUM), RAND UR  CREATININE, URINE, RANDOM  CBC  BASIC METABOLIC PANEL WITH GFR    EKG None  Radiology CT Lumbar Spine Wo Contrast Result Date: 03/31/2024 CLINICAL DATA:  Trauma, multiple falls.  Known pubic ramus fracture. EXAM: CT LUMBAR SPINE WITHOUT CONTRAST TECHNIQUE: Multidetector CT imaging of the lumbar spine was performed without intravenous contrast administration. Multiplanar CT image reconstructions were also generated. RADIATION DOSE REDUCTION: This exam was performed according to the departmental dose-optimization program which includes automated exposure control, adjustment of the mA and/or kV according to patient size and/or use of iterative reconstruction technique. COMPARISON:  None Available. FINDINGS: Segmentation: 5 lumbar type vertebrae. Alignment: Lumbar lordosis is maintained. Trace retrolisthesis of L1 on L2. Subtle grade 1 anterolisthesis of L5 on S1. Mild to moderate dextrocurvature of the lumbar spine centered at L3-4. Vertebrae: There is no compression fracture or displaced fracture within the lumbar spine. There is a displaced fracture of the posterior right twelfth rib with 1 full shaft with displacement. No suspicious osseous lesion. Degenerative endplate changes and Schmorl's nodes at multiple levels. Mild degenerative changes of the bilateral sacroiliac joints. Paraspinal and other soft tissues: The visualized paraspinal soft tissues are unremarkable. Extensive atherosclerosis of  the abdominal aorta and branch vessels. Cholelithiasis noted. 1.1 cm hyperattenuating lesion in the medial right kidney. Additional cyst in the lower pole of the right kidney. Vascular calcification versus nonobstructing calculus in the left kidney. Atelectasis at the right lung base. Multiple cysts in the visualized portion of the right hepatic lobe. Disc levels: Disc space narrowing at multiple levels most pronounced along the left aspect of L3-4 and the right aspect of L5-S1. Vacuum disc phenomenon at multiple levels. Disc bulges at multiple levels in the lumbar spine. Disc bulge and posterior osteophytes along with facet arthrosis at L3-4 resulting in mild spinal canal stenosis. Additional disc bulge, facet arthrosis, and thickening of the ligamentum flavum at L4-5 resulting in mild spinal canal stenosis. There is severe foraminal narrowing on the  right at L5-S1. IMPRESSION: No acute fracture or traumatic malalignment of the lumbar spine. Displaced fracture of the posterior right twelfth rib. Degenerative changes of the lumbar spine as above. 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 Renal and hepatic cysts noted. 1.1 cm hyperattenuating lesion along the medial right kidney favored to reflect a cyst containing a hemorrhage or proteinaceous debris. Consider nonemergent correlation with renal ultrasound. Cholelithiasis. Electronically Signed   By: Denny Flack M.D.   On: 03/31/2024 20:05   CT Head Wo Contrast Result Date: 03/31/2024 CLINICAL DATA:  Mental status change, multiple falls since leaving the hospital on 05/03. EXAM: CT HEAD WITHOUT CONTRAST TECHNIQUE: Contiguous axial images were obtained from the base of the skull through the vertex without intravenous contrast. RADIATION DOSE REDUCTION: This exam was performed according to the departmental dose-optimization program which includes automated exposure control, adjustment of the mA and/or kV according to patient size and/or use of iterative  reconstruction technique. COMPARISON:  CT head 03/25/2024. FINDINGS: Brain: No acute intracranial hemorrhage. No CT evidence of acute infarct. Nonspecific hypoattenuation in the periventricular and subcortical white matter favored to reflect chronic microvascular ischemic changes. No edema, mass effect, or midline shift. The basilar cisterns are patent. Ventricles: The ventricles are normal. Vascular: Atherosclerotic calcifications of the carotid siphons. No hyperdense vessel. Skull: No acute or aggressive finding. Orbits: Orbits are symmetric. Sinuses: The visualized paranasal sinuses are clear. Other: Mastoid air cells are clear. IMPRESSION: No CT evidence of acute intracranial abnormality. Electronically Signed   By: Denny Flack M.D.   On: 03/31/2024 19:52    Procedures Procedures    Medications Ordered in ED Medications  lactated ringers  infusion ( Intravenous New Bag/Given 03/31/24 2259)  nicotine  (NICODERM CQ  - dosed in mg/24 hours) patch 14 mg (14 mg Transdermal Patch Applied 03/31/24 2255)  aspirin  chewable tablet 81 mg (has no administration in time range)  HYDROcodone-acetaminophen  (NORCO) 10-325 MG per tablet 1-2 tablet (1 tablet Oral Given 03/31/24 2253)  atorvastatin  (LIPITOR) tablet 40 mg (has no administration in time range)  docusate sodium  (COLACE) capsule 100 mg (has no administration in time range)  calcium -vitamin D (OSCAL WITH D) 500-5 MG-MCG per tablet 1 tablet (1 tablet Oral Given 03/31/24 2254)  sodium chloride  flush (NS) 0.9 % injection 3 mL (3 mLs Intravenous Given 03/31/24 2153)  sodium chloride  flush (NS) 0.9 % injection 3 mL (3 mLs Intravenous Given 03/31/24 2255)  sodium chloride  flush (NS) 0.9 % injection 3 mL (has no administration in time range)  0.9 %  sodium chloride  infusion (has no administration in time range)  acetaminophen  (TYLENOL ) tablet 650 mg (has no administration in time range)    Or  acetaminophen  (TYLENOL ) suppository 650 mg (has no administration in time  range)  ondansetron  (ZOFRAN ) tablet 4 mg (has no administration in time range)    Or  ondansetron  (ZOFRAN ) injection 4 mg (has no administration in time range)  sodium bicarbonate tablet 650 mg (650 mg Oral Given 03/31/24 2253)  sodium chloride  0.9 % bolus 1,000 mL (0 mLs Intravenous Stopped 03/31/24 2129)    ED Course/ Medical Decision Making/ A&P Clinical Course as of 03/31/24 2304  Fri Mar 31, 2024  1909 Creatinine(!): 2.25 [MT]  1909 BUN(!): 73 [MT]    Clinical Course User Index [MT] Arvilla Birmingham, MD  Medical Decision Making Amount and/or Complexity of Data Reviewed Labs: ordered. Decision-making details documented in ED Course. Radiology: ordered.  Risk Decision regarding hospitalization.   This patient presents to the Emergency Department with complaint of altered mental status.  This involves an extensive number of treatment options, and is a complaint that carries with it a high risk of complications and morbidity.  The differential diagnosis includes hypoglycemia vs metabolic encephalopathy vs infection (including cystitis) vs ICH vs stroke vs polypharmacy vs other  Patient is also having continued falls at home.  I ordered, reviewed, and interpreted labs, including AKI with doubling of creatinine and elevation of BUN consistent of dehydration.  UA without clear evidence of infection I ordered medication IV fluids for dehydration and AKI I ordered imaging studies which included CT of the head and CT of the lumbar spine I independently visualized and interpreted imaging which showed no emergent findings but noted lower rib fracture, likely from prior fall  Additional history was obtained from the patient's grandson at bedside  Patient was stable on reassessment and will be admitted at this time to the hospital for confusion, dehydration, prior recent traumatic injuries, and likely need PT evaluation and SNF placement.  The patient and her  family are in agreement.        Final Clinical Impression(s) / ED Diagnoses Final diagnoses:  AKI (acute kidney injury) (HCC)  Fall, initial encounter  Closed fracture of hip with routine healing, unspecified laterality, subsequent encounter  Confusion    Rx / DC Orders ED Discharge Orders     None         Mirriam Vadala, Janalyn Me, MD 03/31/24 2304

## 2024-03-31 NOTE — ED Notes (Signed)
 Attempted to call floor RN, got voicemail.

## 2024-03-31 NOTE — H&P (Addendum)
 History and Physical    Kristin Berry WUJ:[id number] DOB: Jan 06, 1941 DOA: 03/31/2024  PCP: Roselind Congo, MD   Patient coming from: Home   Chief Complaint:  Chief Complaint  Patient presents with   Fall   ED TRIAGE note:  Pt has fallen 6-8 times since leaving hospital 5/3. Pt has known fractured pubic remus from previous fall. C/o pain all over, worse in middle-lower back.     HPI:  Kristin Berry is a 83 y.o. female with medical history significant of recurrent fall with chronic back pain, history of essential hypertension, CKD stage IV, hyperlipidemia, chronic smoking cigarette and DM type II presented to emergency department for worsening weakness and confusion. Patient was seen in the emergency department on 5/3 for recurrent fall and in the ED further workup revealed right-sided ninth rib fracture and an inferior pubic ramus fracture. During that time case was discussed with orthopedics Dr.Dr. Hermina Loosen who recommends weight bearing as tolerated for the inferior pubic ramus fx .  Patient was advised to be admitted for pain control and rehab placement however patient declined and eventually patient signed AMA as she wants to smoke cigarette.  Of note, patient is living independently in a trailer attached to the patient's grandson home.  During my evaluation at the bedside patient is complaining about lower back pain.  Patient denies any headache, dizziness, chest pain, shortness of breath, palpitation, abdominal pain, syncope and presyncope.   ED Course:  At presentation to ED patient is hemodynamically stable except bradycardic heart rate in between 55-58. BMP showing elevated creatinine 2.25, elevated calcium  10.6, low bicarb 12 and elevated BUN 73. CBC unremarkable stable H&H. UA unremarkable evidence of UTI.  CT head no active disease process. CT lumbar spine no acute fracture or malalignment.  Displaced fracture of the posterior 12th rib.  Renal and hepatic cyst  1.1  cm hyperattenuating lesion along the medial right kidney favored to reflect a cyst containing a hemorrhage or proteinaceous debris. Consider nonemergent correlation with renal ultrasound.  In the ED patient has been treated with 1 L of NS bolus.  This time patient agrees to be admitted to the hospital.  Hospitalist has been consulted for management of recurrent fall, AKI, hypercalcemia, acute metabolic encephalopathy, inferior MI fracture, nondisplaced right-sided 9th and 12th rib fractures.      Significant labs in the ED: Lab Orders         Urinalysis, Routine w reflex microscopic -Urine, Clean Catch         Basic metabolic panel         CBC with Differential         Na and K (sodium & potassium), rand urine         Creatinine, urine, random         CBC         Basic metabolic panel       Review of Systems:  Review of Systems  Constitutional:  Negative for chills, fever and weight loss.  Eyes:  Negative for blurred vision and double vision.  Respiratory:  Negative for cough, sputum production and shortness of breath.   Cardiovascular:  Negative for chest pain, palpitations and orthopnea.  Gastrointestinal:  Negative for abdominal pain, heartburn, nausea and vomiting.  Musculoskeletal:  Positive for falls.  Neurological:  Negative for dizziness, tingling, tremors, sensory change, speech change, weakness and headaches.  Psychiatric/Behavioral:  The patient is not nervous/anxious.     Past Medical History:  Diagnosis Date  Diabetes mellitus type 2 in nonobese (HCC)    Dyslipidemia    Hypertension    Stage 3b chronic kidney disease (CKD) (HCC)    Tobacco dependence     History reviewed. No pertinent surgical history.   reports that she has been smoking cigarettes. She has a 58 pack-year smoking history. She has never used smokeless tobacco. She reports that she does not currently use alcohol . She reports that she does not use drugs.  Allergies  Allergen Reactions    Codeine Nausea And Vomiting   Tramadol Hcl Itching    History reviewed. No pertinent family history.  Prior to Admission medications   Medication Sig Start Date End Date Taking? Authorizing Provider  amLODipine  (NORVASC ) 10 MG tablet Take 10 mg by mouth daily. 12/04/21   [provider]  aspirin  81 MG chewable tablet Chew 81 mg by mouth daily.    [provider]  atenolol  (TENORMIN ) 50 MG tablet Take 50 mg by mouth 2 (two) times daily. 12/04/21   [provider]  atorvastatin  (LIPITOR) 40 MG tablet Take 40 mg by mouth daily. 12/04/21   [provider]  carboxymethylcellulose (REFRESH PLUS) 0.5 % SOLN 1 drop 3 (three) times daily as needed (dry eyes).    [provider]  cholecalciferol (VITAMIN D3) 25 MCG (1000 UNIT) tablet Take 1,000 Units by mouth daily.    [provider]  docusate sodium  (COLACE) 100 MG capsule Take 100 mg by mouth 2 (two) times daily as needed for mild constipation.    [provider]  HYDROcodone-acetaminophen  (NORCO) 10-325 MG tablet Take 1 tablet by mouth every 6 (six) hours as needed for pain. 12/18/21   [provider]     Physical Exam: Vitals:   03/31/24 1407 03/31/24 1803  BP: (!) 129/47 (!) 144/53  Pulse: (!) 55 (!) 58  Resp: 18 16  Temp: 97.6 F (36.4 C) (!) 97.5 F (36.4 C)  TempSrc: Oral Axillary  SpO2: 100% 100%  Weight: 45.4 kg   Height: 5\' 2"  (1.575 m)     Physical Exam Vitals and nursing note reviewed.  Constitutional:      Appearance: Normal appearance.  HENT:     Mouth/Throat:     Mouth: Mucous membranes are dry.  Eyes:     Pupils: Pupils are equal, round, and reactive to light.  Cardiovascular:     Rate and Rhythm: Normal rate and regular rhythm.     Pulses: Normal pulses.     Heart sounds: Normal heart sounds.  Pulmonary:     Effort: Pulmonary effort is normal.     Breath sounds: Normal breath sounds.  Abdominal:     General: Bowel sounds are normal.   Musculoskeletal:     Cervical back: Neck supple.     Right lower leg: No edema.     Left lower leg: No edema.  Skin:    General: Skin is dry.     Capillary Refill: Capillary refill takes less than 2 seconds.  Neurological:     Mental Status: She is alert and oriented to person, place, and time.  Psychiatric:        Mood and Affect: Mood normal.        Behavior: Behavior normal.        Thought Content: Thought content normal.        Judgment: Judgment normal.      Labs on Admission: I have personally reviewed following labs and imaging studies  CBC: Recent Labs  Lab 03/25/24 1914 03/31/24 1829  WBC 5.1 6.9  NEUTROABS 3.5 5.4  HGB 11.5* 11.9*  HCT 35.9* 36.1  MCV 98.1 96.8  PLT 158 218   Basic Metabolic Panel: Recent Labs  Lab 03/25/24 1914 03/31/24 1829  NA 138 136  K 3.9 4.5  CL 109 112*  CO2 21* 12*  GLUCOSE 144* 129*  BUN 32* 73*  CREATININE 1.44* 2.25*  CALCIUM  11.2* 10.6*   GFR: Estimated Creatinine Clearance: 13.8 mL/min (A) (by C-G formula based on SCr of 2.25 mg/dL (H)). Liver Function Tests: Recent Labs  Lab 03/25/24 1914  AST 20  ALT 19  ALKPHOS 114  BILITOT 0.7  PROT 7.5  ALBUMIN 4.4   No results for input(s): "LIPASE", "AMYLASE" in the last 168 hours. No results for input(s): "AMMONIA" in the last 168 hours. Coagulation Profile: Recent Labs  Lab 03/25/24 1914  INR 1.0   Cardiac Enzymes: No results for input(s): "CKTOTAL", "CKMB", "CKMBINDEX", "TROPONINI", "TROPONINIHS" in the last 168 hours. BNP (last 3 results) No results for input(s): "BNP" in the last 8760 hours. HbA1C: No results for input(s): "HGBA1C" in the last 72 hours. CBG: Recent Labs  Lab 03/25/24 1937  GLUCAP 153*   Lipid Profile: No results for input(s): "CHOL", "HDL", "LDLCALC", "TRIG", "CHOLHDL", "LDLDIRECT" in the last 72 hours. Thyroid Function Tests: No results for input(s): "TSH", "T4TOTAL", "FREET4", "T3FREE", "THYROIDAB" in the last 72 hours. Anemia  Panel: No results for input(s): "VITAMINB12", "FOLATE", "FERRITIN", "TIBC", "IRON", "RETICCTPCT" in the last 72 hours. Urine analysis:    Component Value Date/Time   COLORURINE YELLOW 03/31/2024 1502   APPEARANCEUR HAZY (A) 03/31/2024 1502   LABSPEC 1.020 03/31/2024 1502   PHURINE 5.0 03/31/2024 1502   GLUCOSEU NEGATIVE 03/31/2024 1502   HGBUR NEGATIVE 03/31/2024 1502   BILIRUBINUR NEGATIVE 03/31/2024 1502   KETONESUR 5 (A) 03/31/2024 1502   PROTEINUR NEGATIVE 03/31/2024 1502   NITRITE NEGATIVE 03/31/2024 1502   LEUKOCYTESUR NEGATIVE 03/31/2024 1502    Radiological Exams on Admission: I have personally reviewed images CT Lumbar Spine Wo Contrast Result Date: 03/31/2024 CLINICAL DATA:  Trauma, multiple falls.  Known pubic ramus fracture. EXAM: CT LUMBAR SPINE WITHOUT CONTRAST TECHNIQUE: Multidetector CT imaging of the lumbar spine was performed without intravenous contrast administration. Multiplanar CT image reconstructions were also generated. RADIATION DOSE REDUCTION: This exam was performed according to the departmental dose-optimization program which includes automated exposure control, adjustment of the mA and/or kV according to patient size and/or use of iterative reconstruction technique. COMPARISON:  None Available. FINDINGS: Segmentation: 5 lumbar type vertebrae. Alignment: Lumbar lordosis is maintained. Trace retrolisthesis of L1 on L2. Subtle grade 1 anterolisthesis of L5 on S1. Mild to moderate dextrocurvature of the lumbar spine centered at L3-4. Vertebrae: There is no compression fracture or displaced fracture within the lumbar spine. There is a displaced fracture of the posterior right twelfth rib with 1 full shaft with displacement. No suspicious osseous lesion. Degenerative endplate changes and Schmorl's nodes at multiple levels. Mild degenerative changes of the bilateral sacroiliac joints. Paraspinal and other soft tissues: The visualized paraspinal soft tissues are unremarkable.  Extensive atherosclerosis of the abdominal aorta and branch vessels. Cholelithiasis noted. 1.1 cm hyperattenuating lesion in the medial right kidney. Additional cyst in the lower pole of the right kidney. Vascular calcification versus nonobstructing calculus in the left kidney. Atelectasis at the right lung base. Multiple cysts in the visualized portion of the right hepatic lobe. Disc levels: Disc space narrowing at multiple levels most pronounced along  the left aspect of L3-4 and the right aspect of L5-S1. Vacuum disc phenomenon at multiple levels. Disc bulges at multiple levels in the lumbar spine. Disc bulge and posterior osteophytes along with facet arthrosis at L3-4 resulting in mild spinal canal stenosis. Additional disc bulge, facet arthrosis, and thickening of the ligamentum flavum at L4-5 resulting in mild spinal canal stenosis. There is severe foraminal narrowing on the right at L5-S1. IMPRESSION: No acute fracture or traumatic malalignment of the lumbar spine. Displaced fracture of the posterior right twelfth rib. Degenerative changes of the lumbar spine as above. 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 Renal and hepatic cysts noted. 1.1 cm hyperattenuating lesion along the medial right kidney favored to reflect a cyst containing a hemorrhage or proteinaceous debris. Consider nonemergent correlation with renal ultrasound. Cholelithiasis. Electronically Signed   By: Denny Flack M.D.   On: 03/31/2024 20:05   CT Head Wo Contrast Result Date: 03/31/2024 CLINICAL DATA:  Mental status change, multiple falls since leaving the hospital on 05/03. EXAM: CT HEAD WITHOUT CONTRAST TECHNIQUE: Contiguous axial images were obtained from the base of the skull through the vertex without intravenous contrast. RADIATION DOSE REDUCTION: This exam was performed according to the departmental dose-optimization program which includes automated exposure control, adjustment of the mA and/or kV according to patient size  and/or use of iterative reconstruction technique. COMPARISON:  CT head 03/25/2024. FINDINGS: Brain: No acute intracranial hemorrhage. No CT evidence of acute infarct. Nonspecific hypoattenuation in the periventricular and subcortical white matter favored to reflect chronic microvascular ischemic changes. No edema, mass effect, or midline shift. The basilar cisterns are patent. Ventricles: The ventricles are normal. Vascular: Atherosclerotic calcifications of the carotid siphons. No hyperdense vessel. Skull: No acute or aggressive finding. Orbits: Orbits are symmetric. Sinuses: The visualized paranasal sinuses are clear. Other: Mastoid air cells are clear. IMPRESSION: No CT evidence of acute intracranial abnormality. Electronically Signed   By: Denny Flack M.D.   On: 03/31/2024 19:52      Assessment/Plan: Principal Problem:   Acute kidney injury superimposed on chronic kidney disease (HCC) Active Problems:   Acute metabolic encephalopathy   Non-insulin  dependent type 2 diabetes mellitus (HCC)   Hyperlipidemia   Essential hypertension   Recurrent falls   Nondisplaced 9th and 12th rib fracture   Closed fracture of multiple pubic rami, right, sequela   Continuous dependence on cigarette smoking   Metabolic acidosis    Assessment and Plan: Acute kidney injury superimposed CKD stage IV -Patient presented to emergency department accompanied via patient's grandson with complaining of recurrent fall and confusion. - In the ED patient is hemodynamically stable.  She was seen in the ED 5/6 found to have nondisplaced right-sided rib fracture and right pelvic inferior rami fracture.  Orthopedics has been consulted recommended conservative management pain control, PT OT evaluation and rehab placement however patient left AMA.  Presenting today again complaining of recurrent fall and back pain. - Further workup and eval elevated creatinine 2.25 and elevated BUN 73. - UA no evidence of UTI.  Pending urine  creatinine and sodium level. - Status post 1 L of NS bolus in the ED.  Continue maintenance fluid LR 75 cc/h. - Monitor renal function, avoid nephrotoxic agent and monitor urine output.  Acute metabolic encephalopathy-in the setting of AKI -Patient initially presented confusion which has been improved after giving 1 L of LR bolus.  Currently patient is alert oriented x 3.  Complaining about lower back pain.  Recommend billing encephalopathy in  the setting of AKI.  Continue monitor mental status. - Continue delirium precaution  Metabolic acidosis - Low bicarb 12.  Normal anion gap.  Metabolic acidosis in the setting of AKI on CKD stage IV. - Starting oral bicarb 650 mg twice daily.   Right-sided nondisplaced 9th and 12th rib fracture Right sided pelvic inferior rami fracture Recurrent fall She was seen in the ED 5/6 found to have nondisplaced right-sided rib fracture and right pelvic inferior rami fracture.  Orthopedics has been consulted on 03/28/2024 recommended conservative management pain control, PT OT evaluation and rehab placement however patient left AMA.  Presenting today again complaining of recurrent fall and back pain. -CT head no acute intracranial abnormality. - CT lumbar as no evidence of fracture.  However it showed right-sided 12 posteriorly fracture.  No evidence of pneumothorax. - Continue pain control, fall precaution. - Continue supportive care, spirometry and supplemental oxygen. - Consulted PT and OT for evaluation. - Patient might need rehab placement versus home PT and OT therapy  Sinus bradycardia - Holding atenolol  in the setting of bradycardia.  Continuous dependence on smoking -Continue nicotine  patch.  Counseled patient at bedside for smoking cessation  Essential hypertension -Blood pressure within good range.  Holding home blood pressure regimen.  Will resume once appropriate.  Hyperlipidemia -Continue Lipitor  Non-insulin -dependent DM type II History  of DM type II diet managed.  DVT prophylaxis:  SCDs.  Defer pharmacological DVT prophylaxis in the setting of recurrent fall and high risk of bleeding Code Status:  Full Code Diet: Heart healthy carb modified diet Family Communication:   Family was present at bedside, at the time of interview. Opportunity was given to ask question and all questions were answered satisfactorily.  Disposition Plan: Continue to monitor improvement of renal function.  Pending PT and evaluation for possible rehab placement versus home health therapy. Consults: Physical therapy and oxygen therapy Admission status:   Inpatient, Telemetry bed  Severity of Illness: The appropriate patient status for this patient is INPATIENT. Inpatient status is judged to be reasonable and necessary in order to provide the required intensity of service to ensure the patient's safety. The patient's presenting symptoms, physical exam findings, and initial radiographic and laboratory data in the context of their chronic comorbidities is felt to place them at high risk for further clinical deterioration. Furthermore, it is not anticipated that the patient will be medically stable for discharge from the hospital within 2 midnights of admission.   * I certify that at the point of admission it is my clinical judgment that the patient will require inpatient hospital care spanning beyond 2 midnights from the point of admission due to high intensity of service, high risk for further deterioration and high frequency of surveillance required.Aaron Aas    Siena Poehler, MD [facility]  How to contact the [facility] Attending or Consulting provider 7A - 7P or covering provider during after hours 7P -7A, for this patient.  Check the care team in [facility] and look for a) attending/consulting [facility] provider listed and b) the [facility] team listed Log into [email or web address] and use [facility] universal password to access. If you do not have the password, please contact the  hospital operator. Locate the [facility] provider you are looking for under [facility] and page to a number that you can be directly reached. If you still have difficulty reaching the provider, please page the [facility] (Director on Call) for the Hospitalists listed on amion for assistance.  03/31/2024, 9:59 PM

## 2024-03-31 NOTE — ED Triage Notes (Signed)
 Pt has fallen 6-8 times since leaving hospital 5/3. Pt has known fractured pubic remus from previous fall. C/o pain all over, worse in middle-lower back.

## 2024-03-31 NOTE — ED Notes (Signed)
Pt assisted to the bathroom with walker  

## 2024-04-01 DIAGNOSIS — S32591S Other specified fracture of right pubis, sequela: Secondary | ICD-10-CM | POA: Diagnosis not present

## 2024-04-01 DIAGNOSIS — F17210 Nicotine dependence, cigarettes, uncomplicated: Secondary | ICD-10-CM | POA: Diagnosis not present

## 2024-04-01 DIAGNOSIS — N189 Chronic kidney disease, unspecified: Secondary | ICD-10-CM

## 2024-04-01 DIAGNOSIS — G9341 Metabolic encephalopathy: Secondary | ICD-10-CM | POA: Diagnosis not present

## 2024-04-01 DIAGNOSIS — N179 Acute kidney failure, unspecified: Secondary | ICD-10-CM | POA: Diagnosis not present

## 2024-04-01 DIAGNOSIS — I10 Essential (primary) hypertension: Secondary | ICD-10-CM

## 2024-04-01 LAB — BASIC METABOLIC PANEL WITH GFR
Anion gap: 7 (ref 5–15)
BUN: 58 mg/dL — ABNORMAL HIGH (ref 8–23)
CO2: 16 mmol/L — ABNORMAL LOW (ref 22–32)
Calcium: 10.3 mg/dL (ref 8.9–10.3)
Chloride: 115 mmol/L — ABNORMAL HIGH (ref 98–111)
Creatinine, Ser: 1.62 mg/dL — ABNORMAL HIGH (ref 0.44–1.00)
GFR, Estimated: 32 mL/min — ABNORMAL LOW (ref >60)
Glucose, Bld: 157 mg/dL — ABNORMAL HIGH (ref 70–99)
Potassium: 4 mmol/L (ref 3.5–5.1)
Sodium: 138 mmol/L (ref 135–145)

## 2024-04-01 LAB — CBC
HCT: 33.8 % — ABNORMAL LOW (ref 36.0–46.0)
Hemoglobin: 11.2 g/dL — ABNORMAL LOW (ref 12.0–15.0)
MCH: 32.3 pg (ref 26.0–34.0)
MCHC: 33.1 g/dL (ref 30.0–36.0)
MCV: 97.4 fL (ref 80.0–100.0)
Platelets: 189 10*3/uL (ref 150–400)
RBC: 3.47 MIL/uL — ABNORMAL LOW (ref 3.87–5.11)
RDW: 12.9 % (ref 11.5–15.5)
WBC: 6.1 10*3/uL (ref 4.0–10.5)
nRBC: 0 % (ref 0.0–0.2)

## 2024-04-01 MED ORDER — AMLODIPINE BESYLATE 10 MG PO TABS
5.0000 mg | ORAL_TABLET | Freq: Every day | ORAL | Status: DC
  Administered 2024-04-01 – 2024-04-04 (×4): 5 mg via ORAL
  Filled 2024-04-01 (×4): qty 1

## 2024-04-01 MED ORDER — MELATONIN 5 MG PO TABS
5.0000 mg | ORAL_TABLET | Freq: Every evening | ORAL | Status: AC | PRN
  Administered 2024-04-01 – 2024-04-04 (×4): 5 mg via ORAL
  Filled 2024-04-01 (×4): qty 1

## 2024-04-01 MED ORDER — ENSURE ENLIVE PO LIQD
237.0000 mL | Freq: Two times a day (BID) | ORAL | Status: DC
  Administered 2024-04-01 – 2024-04-05 (×5): 237 mL via ORAL

## 2024-04-01 NOTE — Progress Notes (Signed)
 Responded to consult for IV. On arrival to room, noted IV started by RN.

## 2024-04-01 NOTE — Progress Notes (Signed)
 [facility]  PROGRESS NOTE  Kristin Berry ZOX:[id number] DOB: September 06, 1941 DOA: 03/31/2024 PCP: Roselind Congo, MD   Brief HPI:    83 y.o. female with medical history significant of recurrent fall with chronic back pain, history of essential hypertension, CKD stage IV, hyperlipidemia, chronic smoking cigarette and DM type II presented to emergency department for worsening weakness and confusion. Patient was seen in the emergency department on 5/3 for recurrent fall and in the ED further workup revealed right-sided ninth rib fracture and an inferior pubic ramus fracture. During that time case was discussed with orthopedics Dr.Dr. Hermina Loosen who recommends weight bearing as tolerated for the inferior pubic ramus fx .  Patient was advised to be admitted for pain control and rehab placement however patient declined and eventually patient signed AMA as she wants to smoke cigarette CT head no active disease process. CT lumbar spine no acute fracture or malalignment.  Displaced fracture of the posterior 12th rib.  Renal and hepatic cyst  1.1 cm hyperattenuating lesion along the medial right kidney favored to reflect a cyst containing a hemorrhage or proteinaceous debris. Consider nonemergent correlation with renal ultrasound.   In the ED patient has been treated with 1 L of NS bolus.  This time patient agrees to be admitted to the hospital.   Hospitalist has been consulted for management of recurrent fall, AKI, hypercalcemia, acute metabolic encephalopathy, inferior MI fracture, nondisplaced right-sided 9th and 12th rib fractures.     Assessment/Plan:   Acute kidney injury superimposed CKD stage IV -Presented with creatinine of 2.25, BUN 73 - Improved with IV normal saline, today BUN/creatinine is 58/1.62  Acute metabolic encephalopathy-in the setting of AKI -Confusion improved with IV hydration - Continue delirium precaution   Metabolic acidosis - Low bicarb 12.  Normal anion gap.   Metabolic acidosis in the setting of AKI on CKD stage IV. - Starting oral bicarb 650 mg twice daily. -Bicarb has improved to 16 -Follow BMP in a.m.     Right-sided nondisplaced 9th and 12th rib fracture Right sided pelvic inferior rami fracture Recurrent fall She was seen in the ED 5/6 found to have nondisplaced right-sided rib fracture and right pelvic inferior rami fracture.  Orthopedics has been consulted on 03/28/2024 recommended conservative management pain control, PT OT evaluation and rehab placement  -CT head no acute intracranial abnormality. - CT lumbar as no evidence of fracture.  However it showed right-sided 12 posteriorly fracture.  No evidence of pneumothorax. - Continue pain control, fall precaution. - Continue supportive care, spirometry and supplemental oxygen. - Consulted PT and OT for evaluation. - Patient might need rehab placement versus home PT and OT therapy   Sinus bradycardia - Holding atenolol  in the setting of bradycardia.  Continuous dependence on smoking -Continue nicotine  patch.  Counseled patient at bedside for smoking cessation   Essential hypertension -Blood pressure within good range.   - Will start amlodipine  5 mg daily   Hyperlipidemia -Continue Lipitor   Non-insulin -dependent DM type II History of DM type II diet managed.    Medications     aspirin   81 mg Oral Daily   atorvastatin   40 mg Oral Daily   calcium -vitamin D  1 tablet Oral BID   nicotine   14 mg Transdermal Daily   sodium bicarbonate  650 mg Oral BID   sodium chloride  flush  3 mL Intravenous Q12H   sodium chloride  flush  3 mL Intravenous Q12H     Data Reviewed:   CBG:  Recent  Labs  Lab 03/25/24 1937 03/31/24 2220  GLUCAP 153* 108*    SpO2: 100 %    Vitals:   03/31/24 1803 03/31/24 2210 04/01/24 0203 04/01/24 0524  BP: (!) 144/53 (!) 158/63 (!) 131/51 (!) 143/41  Pulse: (!) 58 64 63 62  Resp: 16 18 18 18   Temp: (!) 97.5 F (36.4 C) 97.6 F (36.4 C) 97.7 F  (36.5 C) 97.8 F (36.6 C)  TempSrc: Axillary Oral    SpO2: 100% 100% 100% 100%  Weight:      Height:          Data Reviewed:  Basic Metabolic Panel: Recent Labs  Lab 03/25/24 1914 03/31/24 1829 04/01/24 0427  NA 138 136 138  K 3.9 4.5 4.0  CL 109 112* 115*  CO2 21* 12* 16*  GLUCOSE 144* 129* 157*  BUN 32* 73* 58*  CREATININE 1.44* 2.25* 1.62*  CALCIUM  11.2* 10.6* 10.3    CBC: Recent Labs  Lab 03/25/24 1914 03/31/24 1829 04/01/24 0427  WBC 5.1 6.9 6.1  NEUTROABS 3.5 5.4  --   HGB 11.5* 11.9* 11.2*  HCT 35.9* 36.1 33.8*  MCV 98.1 96.8 97.4  PLT 158 218 189    LFT Recent Labs  Lab 03/25/24 1914  AST 20  ALT 19  ALKPHOS 114  BILITOT 0.7  PROT 7.5  ALBUMIN 4.4     Antibiotics: Anti-infectives (From admission, onward)    None        DVT prophylaxis: SCDs  Code Status: Full code  Family Communication: No family at bedside   CONSULTS    Subjective   Denies any complaints   Objective    Physical Examination:   General-appears in no acute distress Heart-S1-S2, regular, no murmur auscultated Lungs-clear to auscultation bilaterally, no wheezing or crackles auscultated Abdomen-soft, nontender, no organomegaly Extremities-no edema in the lower extremities Neuro-alert, oriented x3, no focal deficit noted   Status is: Inpatient:             Kristin Berry   [facility] If 7PM-7AM, please contact night-coverage at [email or web address], Office  [phone]   04/01/2024, 8:37 AM  LOS: 1 day

## 2024-04-01 NOTE — Evaluation (Signed)
 Occupational Therapy Evaluation Patient Details Name: Kristin Berry MRN: [id number] DOB: 1941-03-09 Today's Date: 04/01/2024   History of Present Illness   "83 y.o. female with medical history significant of recurrent fall with chronic back pain, history of essential hypertension, CKD stage IV, hyperlipidemia, chronic smoking cigarette and DM type II presented to emergency department for worsening weakness and confusion.  Patient was seen in the emergency department on 5/3 for recurrent fall and in the ED further workup revealed right-sided ninth rib fracture and an inferior pubic ramus fracture. During that time case was discussed with orthopedics Dr. Hermina Loosen who recommends weight bearing as tolerated for the inferior pubic ramus fx.  Patient was advised to be admitted for pain control and rehab placement however patient declined and eventually patient signed AMA as she wants to smoke cigarette."     Clinical Impressions Pt admitted with the above. Pt currently with functional limitations due to the deficits listed below (see OT Problem List).  Pt will benefit from acute skilled OT to increase their safety and independence with ADL and functional mobility for ADL to facilitate discharge.       If plan is discharge home, recommend the following:   A little help with walking and/or transfers;A little help with bathing/dressing/bathroom     Functional Status Assessment   Patient has had a recent decline in their functional status and demonstrates the ability to make significant improvements in function in a reasonable and predictable amount of time.       Precautions/Restrictions   Precautions Precautions: Fall     Mobility Bed Mobility Overal bed mobility: Needs Assistance Bed Mobility: Supine to Sit     Supine to sit: Min assist, HOB elevated     General bed mobility comments: required light assist for trunk upright    Transfers Overall transfer level: Needs  assistance Equipment used: Rolling walker (2 wheels) Transfers: Sit to/from Stand Sit to Stand: Min assist           General transfer comment: verbal cues for hand placement, assist to rise and stabilize as well as control descent      Balance Overall balance assessment: Needs assistance, History of Falls         Standing balance support: Bilateral upper extremity supported, During functional activity                               ADL either performed or assessed with clinical judgement   ADL Overall ADL's : Needs assistance/impaired Eating/Feeding: Set up;Sitting   Grooming: Minimal assistance;Sitting   Upper Body Bathing: Minimal assistance;Sitting   Lower Body Bathing: Minimal assistance;Sit to/from stand   Upper Body Dressing : Minimal assistance;Sitting   Lower Body Dressing: Minimal assistance;Sit to/from stand;Cueing for safety;Cueing for sequencing   Toilet Transfer: Minimal assistance       Tub/ Shower Transfer: Minimal assistance   Functional mobility during ADLs: Minimal assistance       Vision Patient Visual Report: No change from baseline              Pertinent Vitals/Pain Pain Assessment Pain Assessment: No/denies pain     Extremity/Trunk Assessment Upper Extremity Assessment Upper Extremity Assessment: Generalized weakness   Lower Extremity Assessment Lower Extremity Assessment: Generalized weakness   Cervical / Trunk Assessment Cervical / Trunk Assessment: Kyphotic   Communication Communication Communication: Impaired Factors Affecting Communication: Hearing impaired   Cognition Arousal: Alert Behavior During Therapy: [facility]  for tasks assessed/performed                                 Following commands: Intact       Cueing  General Comments   Cueing Techniques: Verbal cues  Pt reports falls prior to admission however thinks they are from dizziness.  No dizziness reported with ambulation  today.           Home Living Family/patient expects to be discharged to:: Private residence Living Arrangements: Alone Available Help at Discharge: Family;Available PRN/intermittently Type of Home: Mobile home       Home Layout: One level         Bathroom Toilet: Standard     Home Equipment: Rollator (4 wheels)   Additional Comments: PLOF from chart, pt poor historian      Prior Functioning/Environment Prior Level of Function : Patient poor historian/Family not available             Mobility Comments: using rollator, reports family lives next door ADLs Comments: pt states usually does a bird bath. grandson A - but does not live with her    OT Problem List: Decreased strength;Decreased safety awareness;Impaired balance (sitting and/or standing);Decreased activity tolerance;Impaired vision/perception   OT Treatment/Interventions: Self-care/ADL training;Patient/family education      OT Goals(Current goals can be found in the care plan section)   Acute Rehab OT Goals Patient Stated Goal: get well and get home OT Goal Formulation: With patient Time For Goal Achievement: 04/15/24 Potential to Achieve Goals: Good   OT Frequency:  Min 2X/week       AM-PAC OT "6 Clicks" Daily Activity     Outcome Measure Help from another person eating meals?: None Help from another person taking care of personal grooming?: None Help from another person toileting, which includes using toliet, bedpan, or urinal?: A Little Help from another person bathing (including washing, rinsing, drying)?: A Little Help from another person to put on and taking off regular upper body clothing?: None Help from another person to put on and taking off regular lower body clothing?: A Little 6 Click Score: 21   End of Session Nurse Communication: Mobility status  Activity Tolerance: Patient tolerated treatment well Patient left: in chair;with call bell/phone within reach  OT Visit Diagnosis:  Unsteadiness on feet (R26.81);History of falling (Z91.81);Repeated falls (R29.6);Muscle weakness (generalized) (M62.81)                Time: 1191-4782 OT Time Calculation (min): 17 min Charges:  OT General Charges $OT Visit: 1 Visit OT Evaluation $OT Eval Low Complexity: 1 Low    Korie Brabson, Berline Brenner 04/01/2024, 3:58 PM

## 2024-04-01 NOTE — Evaluation (Signed)
 Physical Therapy Evaluation Patient Details Name: RINNAH BEILSTEIN MRN: [id number] DOB: 1941-01-26 Today's Date: 04/01/2024  History of Present Illness  "83 y.o. female with medical history significant of recurrent fall with chronic back pain, history of essential hypertension, CKD stage IV, hyperlipidemia, chronic smoking cigarette and DM type II presented to emergency department for worsening weakness and confusion.  Patient was seen in the emergency department on 5/3 for recurrent fall and in the ED further workup revealed right-sided ninth rib fracture and an inferior pubic ramus fracture. During that time case was discussed with orthopedics Dr. Hermina Loosen who recommends weight bearing as tolerated for the inferior pubic ramus fx.  Patient was advised to be admitted for pain control and rehab placement however patient declined and eventually patient signed AMA as she wants to smoke cigarette."  Clinical Impression  Pt admitted with above diagnosis.  Pt currently with functional limitations due to the deficits listed below (see PT Problem List). Pt will benefit from acute skilled PT to increase their independence and safety with mobility to allow discharge.  Pt assisted with ambulating in hallway.  Pt reports pain surprisingly improved however she has had numerous falls at home.  Pt lives alone with family close-by and reports using a rollator. Patient will benefit from continued inpatient follow up therapy, <3 hours/day (unless family can provide 24/7 supervision/assist).           If plan is discharge home, recommend the following: A little help with walking and/or transfers;A little help with bathing/dressing/bathroom;Assistance with cooking/housework;Assist for transportation;Help with stairs or ramp for entrance   Can travel by private vehicle   Yes    Equipment Recommendations None recommended by PT  Recommendations for Other Services       Functional Status Assessment Patient has had a  recent decline in their functional status and demonstrates the ability to make significant improvements in function in a reasonable and predictable amount of time.     Precautions / Restrictions Precautions Precautions: Fall      Mobility  Bed Mobility Overal bed mobility: Needs Assistance Bed Mobility: Supine to Sit     Supine to sit: Min assist, HOB elevated     General bed mobility comments: required light assist for trunk upright    Transfers Overall transfer level: Needs assistance Equipment used: Rolling walker (2 wheels) Transfers: Sit to/from Stand Sit to Stand: Min assist           General transfer comment: verbal cues for hand placement, assist to rise and stabilize as well as control descent    Ambulation/Gait Ambulation/Gait assistance: Min assist Gait Distance (Feet): 80 Feet Assistive device: Rolling walker (2 wheels) Gait Pattern/deviations: Step-through pattern, Decreased stride length, Decreased weight shift to left, Decreased stance time - left, Trunk flexed Gait velocity: fluctuating fast and slow (reports "stops and thinks" while she's walking)     General Gait Details: pt oddly reports pain on left side despite R rib fxs and R pelvic rami fx, cues for safety and use of RW; pt denies dizziness  Stairs            Wheelchair Mobility     Tilt Bed    Modified Rankin (Stroke Patients Only)       Balance Overall balance assessment: Needs assistance, History of Falls         Standing balance support: Bilateral upper extremity supported, During functional activity Standing balance-Leahy Scale: Poor Standing balance comment: reliant on UE support  Pertinent Vitals/Pain Pain Assessment Pain Assessment: Faces Faces Pain Scale: Hurts little more Pain Location: left flank and hip Pain Descriptors / Indicators: Sore, Grimacing, Guarding Pain Intervention(s): Repositioned, Monitored during  session    Home Living Family/patient expects to be discharged to:: Private residence Living Arrangements: Alone Available Help at Discharge: Family;Available PRN/intermittently Type of Home: Mobile home         Home Layout: One level Home Equipment: Rollator (4 wheels) Additional Comments: PLOF from chart, pt poor historian    Prior Function Prior Level of Function : Patient poor historian/Family not available             Mobility Comments: using rollator, reports family lives next door       Extremity/Trunk Assessment        Lower Extremity Assessment Lower Extremity Assessment: Generalized weakness    Cervical / Trunk Assessment Cervical / Trunk Assessment: Kyphotic  Communication   Communication Communication: Impaired Factors Affecting Communication: Hearing impaired    Cognition Arousal: Alert Behavior During Therapy: WFL for tasks assessed/performed   PT - Cognitive impairments: Orientation   Orientation impairments: Place, Time                   PT - Cognition Comments: pt also does not recall being shown how to use call bell (RN reports showing her this morning as well) Following commands: Intact       Cueing Cueing Techniques: Verbal cues     General Comments General comments (skin integrity, edema, etc.): Pt reports falls prior to admission however thinks they are from dizziness.  No dizziness reported with ambulation today.    Exercises     Assessment/Plan    PT Assessment Patient needs continued PT services  PT Problem List Decreased strength;Decreased coordination;Decreased mobility;Decreased safety awareness;Decreased knowledge of use of DME;Decreased activity tolerance;Decreased balance       PT Treatment Interventions DME instruction;Gait training;Balance training;Functional mobility training;Therapeutic activities;Therapeutic exercise;Patient/family education    PT Goals (Current goals can be found in the Care Plan  section)  Acute Rehab PT Goals PT Goal Formulation: With patient Time For Goal Achievement: 04/15/24 Potential to Achieve Goals: Good    Frequency Min 2X/week     Co-evaluation               AM-PAC PT "6 Clicks" Mobility  Outcome Measure Help needed turning from your back to your side while in a flat bed without using bedrails?: A Little Help needed moving from lying on your back to sitting on the side of a flat bed without using bedrails?: A Little Help needed moving to and from a bed to a chair (including a wheelchair)?: A Little Help needed standing up from a chair using your arms (e.g., wheelchair or bedside chair)?: A Little Help needed to walk in hospital room?: A Little Help needed climbing 3-5 steps with a railing? : A Lot 6 Click Score: 17    End of Session Equipment Utilized During Treatment: Gait belt Activity Tolerance: Patient tolerated treatment well Patient left: in chair;with call bell/phone within reach;with chair alarm set Nurse Communication: Mobility status PT Visit Diagnosis: Difficulty in walking, not elsewhere classified (R26.2);Unsteadiness on feet (R26.81)    Time: 1610-9604 PT Time Calculation (min) (ACUTE ONLY): 13 min   Charges:   PT Evaluation $PT Eval Low Complexity: 1 Low   PT General Charges $$ ACUTE PT VISIT: 1 Visit        Henretta Lodge PT, DPT Physical Therapist Acute Rehabilitation  Services Office: [phone]   Samual Crochet 04/01/2024, 1:23 PM

## 2024-04-02 DIAGNOSIS — S32591S Other specified fracture of right pubis, sequela: Secondary | ICD-10-CM | POA: Diagnosis not present

## 2024-04-02 DIAGNOSIS — N179 Acute kidney failure, unspecified: Secondary | ICD-10-CM | POA: Diagnosis not present

## 2024-04-02 DIAGNOSIS — F17210 Nicotine dependence, cigarettes, uncomplicated: Secondary | ICD-10-CM | POA: Diagnosis not present

## 2024-04-02 DIAGNOSIS — G9341 Metabolic encephalopathy: Secondary | ICD-10-CM | POA: Diagnosis not present

## 2024-04-02 LAB — CBC
HCT: 32.3 % — ABNORMAL LOW (ref 36.0–46.0)
Hemoglobin: 10.3 g/dL — ABNORMAL LOW (ref 12.0–15.0)
MCH: 31.1 pg (ref 26.0–34.0)
MCHC: 31.9 g/dL (ref 30.0–36.0)
MCV: 97.6 fL (ref 80.0–100.0)
Platelets: 172 10*3/uL (ref 150–400)
RBC: 3.31 MIL/uL — ABNORMAL LOW (ref 3.87–5.11)
RDW: 12.8 % (ref 11.5–15.5)
WBC: 4.8 10*3/uL (ref 4.0–10.5)
nRBC: 0 % (ref 0.0–0.2)

## 2024-04-02 LAB — COMPREHENSIVE METABOLIC PANEL WITH GFR
ALT: 21 U/L (ref 0–44)
AST: 21 U/L (ref 15–41)
Albumin: 3.1 g/dL — ABNORMAL LOW (ref 3.5–5.0)
Alkaline Phosphatase: 96 U/L (ref 38–126)
Anion gap: 8 (ref 5–15)
BUN: 37 mg/dL — ABNORMAL HIGH (ref 8–23)
CO2: 17 mmol/L — ABNORMAL LOW (ref 22–32)
Calcium: 10.6 mg/dL — ABNORMAL HIGH (ref 8.9–10.3)
Chloride: 110 mmol/L (ref 98–111)
Creatinine, Ser: 1.44 mg/dL — ABNORMAL HIGH (ref 0.44–1.00)
GFR, Estimated: 36 mL/min — ABNORMAL LOW (ref >60)
Glucose, Bld: 207 mg/dL — ABNORMAL HIGH (ref 70–99)
Potassium: 3.6 mmol/L (ref 3.5–5.1)
Sodium: 135 mmol/L (ref 135–145)
Total Bilirubin: 0.4 mg/dL (ref 0.0–1.2)
Total Protein: 5.6 g/dL — ABNORMAL LOW (ref 6.5–8.1)

## 2024-04-02 NOTE — Progress Notes (Signed)
 [facility]  PROGRESS NOTE  Kristin Berry ZOX:[id number] DOB: 04-21-1941 DOA: 03/31/2024 PCP: Roselind Congo, MD   Brief HPI:    83 y.o. female with medical history significant of recurrent fall with chronic back pain, history of essential hypertension, CKD stage IV, hyperlipidemia, chronic smoking cigarette and DM type II presented to emergency department for worsening weakness and confusion. Patient was seen in the emergency department on 5/3 for recurrent fall and in the ED further workup revealed right-sided ninth rib fracture and an inferior pubic ramus fracture. During that time case was discussed with orthopedics Dr.Dr. Hermina Loosen who recommends weight bearing as tolerated for the inferior pubic ramus fx .  Patient was advised to be admitted for pain control and rehab placement however patient declined and eventually patient signed AMA as she wants to smoke cigarette CT head no active disease process. CT lumbar spine no acute fracture or malalignment.  Displaced fracture of the posterior 12th rib.  Renal and hepatic cyst  1.1 cm hyperattenuating lesion along the medial right kidney favored to reflect a cyst containing a hemorrhage or proteinaceous debris. Consider nonemergent correlation with renal ultrasound.   In the ED patient has been treated with 1 L of NS bolus.  This time patient agrees to be admitted to the hospital.   Hospitalist has been consulted for management of recurrent fall, AKI, hypercalcemia, acute metabolic encephalopathy, inferior MI fracture, nondisplaced right-sided 9th and 12th rib fractures.     Assessment/Plan:   Acute kidney injury superimposed CKD stage IV -Presented with creatinine of 2.25, BUN 73 - Improved with IV normal saline, today BUN/creatinine is 37/1.44 - At baseline  Acute metabolic encephalopathy-in the setting of AKI -Confusion improved with IV hydration - Continue delirium precaution   Metabolic acidosis - Low bicarb 12.  Normal  anion gap.  Metabolic acidosis in the setting of AKI on CKD stage IV. - Starting oral bicarb 650 mg twice daily. -Bicarb has improved to 17      Right-sided nondisplaced 9th and 12th rib fracture Right sided pelvic inferior rami fracture Recurrent fall She was seen in the ED 5/6 found to have nondisplaced right-sided rib fracture and right pelvic inferior rami fracture.  Orthopedics has been consulted on 03/28/2024 recommended conservative management pain control, PT OT evaluation and rehab placement  -CT head no acute intracranial abnormality. - CT lumbar as no evidence of fracture.  However it showed right-sided 12 posteriorly fracture.  No evidence of pneumothorax. - Continue pain control, fall precaution. - Continue supportive care, spirometry and supplemental oxygen. - Consulted PT and OT for evaluation. - Patient likely will need skilled nursing facility for rehab   Sinus bradycardia - Holding atenolol  in the setting of bradycardia.  Continuous dependence on smoking -Continue nicotine  patch.  Counseled patient at bedside for smoking cessation   Essential hypertension -Blood pressure elevated after holding atenolol  - Improved after starting  amlodipine  5 mg daily   Hyperlipidemia -Continue Lipitor   Non-insulin -dependent DM type II History of DM type II diet managed.    Medications     amLODipine   5 mg Oral Daily   aspirin   81 mg Oral Daily   atorvastatin   40 mg Oral Daily   calcium -vitamin D  1 tablet Oral BID   feeding supplement  237 mL Oral BID BM   nicotine   14 mg Transdermal Daily   sodium bicarbonate  650 mg Oral BID   sodium chloride  flush  3 mL Intravenous Q12H   sodium chloride   flush  3 mL Intravenous Q12H     Data Reviewed:   CBG:  Recent Labs  Lab 03/31/24 2220  GLUCAP 108*    SpO2: 99 %    Vitals:   04/01/24 1219 04/01/24 1618 04/01/24 1959 04/02/24 0441  BP: (!) 164/58 (!) 151/58 (!) 146/51 (!) 150/56  Pulse: 66 80 65 65  Resp: 16      Temp: (!) 97.5 F (36.4 C)  98.4 F (36.9 C) 97.7 F (36.5 C)  TempSrc: Oral  Oral Oral  SpO2: 100% 100% 100% 99%  Weight:      Height:          Data Reviewed:  Basic Metabolic Panel: Recent Labs  Lab 03/31/24 1829 04/01/24 0427 04/02/24 0435  NA 136 138 135  K 4.5 4.0 3.6  CL 112* 115* 110  CO2 12* 16* 17*  GLUCOSE 129* 157* 207*  BUN 73* 58* 37*  CREATININE 2.25* 1.62* 1.44*  CALCIUM  10.6* 10.3 10.6*    CBC: Recent Labs  Lab 03/31/24 1829 04/01/24 0427 04/02/24 0435  WBC 6.9 6.1 4.8  NEUTROABS 5.4  --   --   HGB 11.9* 11.2* 10.3*  HCT 36.1 33.8* 32.3*  MCV 96.8 97.4 97.6  PLT 218 189 172    LFT Recent Labs  Lab 04/02/24 0435  AST 21  ALT 21  ALKPHOS 96  BILITOT 0.4  PROT 5.6*  ALBUMIN 3.1*     Antibiotics: Anti-infectives (From admission, onward)    None        DVT prophylaxis: SCDs  Code Status: Full code  Family Communication: No family at bedside   CONSULTS    Subjective   Denies any pain.   Objective    Physical Examination:   Appears in no acute distress S1-S2, regular Lungs clear to auscultation bilaterally   Status is: Inpatient:             Kristin Berry   [facility] If 7PM-7AM, please contact night-coverage at [email or web address], Office  [phone]   04/02/2024, 8:06 AM  LOS: 2 days

## 2024-04-03 DIAGNOSIS — F17210 Nicotine dependence, cigarettes, uncomplicated: Secondary | ICD-10-CM | POA: Diagnosis not present

## 2024-04-03 DIAGNOSIS — N179 Acute kidney failure, unspecified: Secondary | ICD-10-CM | POA: Diagnosis not present

## 2024-04-03 DIAGNOSIS — G9341 Metabolic encephalopathy: Secondary | ICD-10-CM | POA: Diagnosis not present

## 2024-04-03 DIAGNOSIS — S32591S Other specified fracture of right pubis, sequela: Secondary | ICD-10-CM | POA: Diagnosis not present

## 2024-04-03 LAB — BASIC METABOLIC PANEL WITH GFR
Anion gap: 7 (ref 5–15)
BUN: 24 mg/dL — ABNORMAL HIGH (ref 8–23)
CO2: 20 mmol/L — ABNORMAL LOW (ref 22–32)
Calcium: 10.8 mg/dL — ABNORMAL HIGH (ref 8.9–10.3)
Chloride: 111 mmol/L (ref 98–111)
Creatinine, Ser: 1.15 mg/dL — ABNORMAL HIGH (ref 0.44–1.00)
GFR, Estimated: 48 mL/min — ABNORMAL LOW (ref >60)
Glucose, Bld: 156 mg/dL — ABNORMAL HIGH (ref 70–99)
Potassium: 3.7 mmol/L (ref 3.5–5.1)
Sodium: 138 mmol/L (ref 135–145)

## 2024-04-03 MED ORDER — POLYVINYL ALCOHOL 1.4 % OP SOLN
1.0000 [drp] | OPHTHALMIC | Status: DC | PRN
Start: 2024-04-03 — End: 2024-04-06
  Administered 2024-04-03 – 2024-04-06 (×3): 1 [drp] via OPHTHALMIC
  Filled 2024-04-03: qty 15

## 2024-04-03 NOTE — Plan of Care (Incomplete)
  Problem: Education: Goal: Knowledge of General Education information will improve Description: Including pain rating scale, medication(s)/side effects and non-pharmacologic comfort measures Outcome: Progressing   Problem: Health Behavior/Discharge Planning: Goal: Ability to manage health-related needs will improve Outcome: Progressing   Problem: Clinical Measurements: Goal: Diagnostic test results will improve Outcome: Progressing   Problem: Activity: Goal: Risk for activity intolerance will decrease Outcome: Progressing   Problem: Nutrition: Goal: Adequate nutrition will be maintained Outcome: Progressing   Problem: Coping: Goal: Level of anxiety will decrease Outcome: Progressing   Problem: Pain Managment: Goal: General experience of comfort will improve and/or be controlled Outcome: Progressing   Problem: Safety: Goal: Ability to remain free from injury will improve Outcome: Progressing   Problem: Clinical Measurements: Goal: Respiratory complications will improve Outcome: Adequate for Discharge Goal: Cardiovascular complication will be avoided Outcome: Adequate for Discharge   Problem: Elimination: Goal: Will not experience complications related to bowel motility Outcome: Adequate for Discharge Goal: Will not experience complications related to urinary retention Outcome: Adequate for Discharge   Problem: Skin Integrity: Goal: Risk for impaired skin integrity will decrease Outcome: Adequate for Discharge

## 2024-04-03 NOTE — Progress Notes (Signed)
 [facility]  PROGRESS NOTE  Galene Perdomo Dimascio ZOX:[id number] DOB: 1941-06-24 DOA: 03/31/2024 PCP: Roselind Congo, MD   Brief HPI:    83 y.o. female with medical history significant of recurrent fall with chronic back pain, history of essential hypertension, CKD stage IV, hyperlipidemia, chronic smoking cigarette and DM type II presented to emergency department for worsening weakness and confusion. Patient was seen in the emergency department on 5/3 for recurrent fall and in the ED further workup revealed right-sided ninth rib fracture and an inferior pubic ramus fracture. During that time case was discussed with orthopedics Dr.Dr. Hermina Loosen who recommends weight bearing as tolerated for the inferior pubic ramus fx .  Patient was advised to be admitted for pain control and rehab placement however patient declined and eventually patient signed AMA as she wants to smoke cigarette CT head no active disease process. CT lumbar spine no acute fracture or malalignment.  Displaced fracture of the posterior 12th rib.  Renal and hepatic cyst  1.1 cm hyperattenuating lesion along the medial right kidney favored to reflect a cyst containing a hemorrhage or proteinaceous debris. Consider nonemergent correlation with renal ultrasound.   In the ED patient has been treated with 1 L of NS bolus.  This time patient agrees to be admitted to the hospital.   Hospitalist has been consulted for management of recurrent fall, AKI, hypercalcemia, acute metabolic encephalopathy, inferior MI fracture, nondisplaced right-sided 9th and 12th rib fractures.     Assessment/Plan:   Acute kidney injury superimposed CKD stage IV -Presented with creatinine of 2.25, BUN 73 - Improved with IV normal saline, today BUN/creatinine is 24/1.15 - At baseline  Acute metabolic encephalopathy-in the setting of AKI -Confusion improved with IV hydration - Continue delirium precaution   Metabolic acidosis - Low bicarb 12.  Normal  anion gap.  Metabolic acidosis in the setting of AKI on CKD stage IV. - Starting oral bicarb 650 mg twice daily. -Bicarb has improved to 20    Right-sided nondisplaced 9th and 12th rib fracture Right sided pelvic inferior rami fracture Recurrent fall She was seen in the ED 5/6 found to have nondisplaced right-sided rib fracture and right pelvic inferior rami fracture.  Orthopedics  consulted on 03/28/2024 recommended conservative management pain control, PT OT evaluation and rehab placement  -CT head no acute intracranial abnormality. - CT lumbar as no evidence of fracture.  However it showed right-sided 12 posteriorly fracture.  No evidence of pneumothorax. - Continue pain control, fall precaution. - Continue supportive care, spirometry and supplemental oxygen. - Consulted PT and OT for evaluation. - Patient likely will need skilled nursing facility for rehab   Sinus bradycardia - Holding atenolol  in the setting of bradycardia.  Continuous dependence on smoking -Continue nicotine  patch.  Counseled patient at bedside for smoking cessation   Essential hypertension -Blood pressure elevated after holding atenolol  - Improved after starting  amlodipine  5 mg daily   Hyperlipidemia -Continue Lipitor   Non-insulin -dependent DM type II History of DM type II diet managed.    Medications     amLODipine   5 mg Oral Daily   aspirin   81 mg Oral Daily   atorvastatin   40 mg Oral Daily   calcium -vitamin D  1 tablet Oral BID   feeding supplement  237 mL Oral BID BM   nicotine   14 mg Transdermal Daily   sodium bicarbonate  650 mg Oral BID   sodium chloride  flush  3 mL Intravenous Q12H   sodium chloride  flush  3  mL Intravenous Q12H     Data Reviewed:   CBG:  Recent Labs  Lab 03/31/24 2220  GLUCAP 108*    SpO2: 98 %    Vitals:   04/02/24 0441 04/02/24 1256 04/02/24 1940 04/03/24 0450  BP: (!) 150/56 129/63 (!) 137/51 (!) 147/60  Pulse: 65 63 68 (!) 58  Resp:  20 15 17   Temp:  97.7 F (36.5 C) (!) 97.5 F (36.4 C) 98.3 F (36.8 C) 98.3 F (36.8 C)  TempSrc: Oral Oral Oral   SpO2: 99% 99% 99% 98%  Weight:      Height:          Data Reviewed:  Basic Metabolic Panel: Recent Labs  Lab 03/31/24 1829 04/01/24 0427 04/02/24 0435  NA 136 138 135  K 4.5 4.0 3.6  CL 112* 115* 110  CO2 12* 16* 17*  GLUCOSE 129* 157* 207*  BUN 73* 58* 37*  CREATININE 2.25* 1.62* 1.44*  CALCIUM  10.6* 10.3 10.6*    CBC: Recent Labs  Lab 03/31/24 1829 04/01/24 0427 04/02/24 0435  WBC 6.9 6.1 4.8  NEUTROABS 5.4  --   --   HGB 11.9* 11.2* 10.3*  HCT 36.1 33.8* 32.3*  MCV 96.8 97.4 97.6  PLT 218 189 172    LFT Recent Labs  Lab 04/02/24 0435  AST 21  ALT 21  ALKPHOS 96  BILITOT 0.4  PROT 5.6*  ALBUMIN 3.1*     Antibiotics: Anti-infectives (From admission, onward)    None        DVT prophylaxis: SCDs  Code Status: Full code  Family Communication: No family at bedside   CONSULTS    Subjective   Denies pain.   Objective    Physical Examination:  General-appears in no acute distress Heart-S1-S2, regular, no murmur auscultated Lungs-clear to auscultation bilaterally, no wheezing or crackles auscultated Abdomen-soft, nontender, no organomegaly Extremities-no edema in the lower extremities Neuro-alert, oriented x3, no focal deficit noted   Status is: Inpatient:             Ozell Blunt   [facility] If 7PM-7AM, please contact night-coverage at [email or web address], Office  [phone]   04/03/2024, 8:13 AM  LOS: 3 days

## 2024-04-03 NOTE — NC FL2 (Signed)
 [place]  MEDICAID FL2 LEVEL OF CARE FORM     IDENTIFICATION  Patient Name: Kristin Berry Birthdate: 08-28-1941 Sex: female Admission Date (Current Location): 03/31/2024  [facility] and [place] Number:  Producer, television/film/video and Address:  [facility],  [address], [place] [address]      Provider Number: [id number]  Attending Physician Name and Address:  Ozell Blunt, MD  Relative Name and Phone Number:  [name] [facility])  [phone] [facility])    Current Level of Care: Hospital Recommended Level of Care: Skilled Nursing Facility Prior Approval Number:    Date Approved/Denied:   PASRR Number: [id number]  Discharge Plan: Home    Current Diagnoses: Patient Active Problem List   Diagnosis Date Noted   Acute metabolic encephalopathy 03/31/2024   Recurrent falls 03/31/2024   Nondisplaced 9th and 12th rib fracture 03/31/2024   Closed fracture of multiple pubic rami, right, sequela 03/31/2024   Continuous dependence on cigarette smoking 03/31/2024   Metabolic acidosis 03/31/2024   Acute kidney injury superimposed on chronic kidney disease (HCC) 01/02/2022   Non-insulin  dependent type 2 diabetes mellitus (HCC) 01/02/2022   Hyperlipidemia 01/02/2022   Essential hypertension 01/02/2022   Stage 3b chronic kidney disease (CKD) (HCC) 01/02/2022   Tobacco dependence 01/02/2022   Ambulatory dysfunction 01/02/2022   UTI (urinary tract infection) 01/02/2022   Thyroid nodule 01/02/2022   DNR (do not resuscitate) 01/02/2022   Colles' fracture 01/02/2022    Orientation RESPIRATION BLADDER Height & Weight     Self, Situation, Place  Normal Continent Weight: 100 lb (45.4 kg) Height:  5\' 2"  (157.5 cm)  BEHAVIORAL SYMPTOMS/MOOD NEUROLOGICAL BOWEL NUTRITION STATUS      Continent    AMBULATORY STATUS COMMUNICATION OF NEEDS Skin   Limited Assist Verbally  (skin tear Elbow left)                       Personal Care Assistance Level of Assistance   Bathing, Feeding, Dressing Bathing Assistance: Limited assistance Feeding assistance: Independent Dressing Assistance: Limited assistance     Functional Limitations Info  Sight, Hearing, Speech Sight Info: Impaired (glasses) Hearing Info: Adequate Speech Info: Adequate    SPECIAL CARE FACTORS FREQUENCY  PT (By licensed PT), OT (By licensed OT)     PT Frequency: 5 x a week OT Frequency: 5 x a week            Contractures Contractures Info: Not present    Additional Factors Info  Code Status, Allergies Code Status Info: full Allergies Info: Codeine, Tamadol Hcl           Current Medications (04/03/2024):  This is the current hospital active medication list Current Facility-Administered Medications  Medication Dose Route Frequency Provider Last Rate Last Admin   acetaminophen  (TYLENOL ) tablet 650 mg  650 mg Oral Q6H PRN Sundil, Subrina, MD       Or   acetaminophen  (TYLENOL ) suppository 650 mg  650 mg Rectal Q6H PRN Sundil, Subrina, MD       amLODipine  (NORVASC ) tablet 5 mg  5 mg Oral Daily Ozell Blunt, MD   5 mg at 04/03/24 8469   aspirin  chewable tablet 81 mg  81 mg Oral Daily Sundil, Subrina, MD   81 mg at 04/03/24 0934   atorvastatin  (LIPITOR) tablet 40 mg  40 mg Oral Daily Sundil, Subrina, MD   40 mg at 04/03/24 6295   calcium -vitamin D (OSCAL WITH D) 500-5 MG-MCG per tablet 1 tablet  1 tablet Oral BID Sundil, Subrina, MD   1 tablet at 04/03/24 0934   docusate sodium  (COLACE) capsule 100 mg  100 mg Oral BID PRN Sundil, Subrina, MD   100 mg at 04/03/24 1306   feeding supplement (ENSURE ENLIVE / ENSURE PLUS) liquid 237 mL  237 mL Oral BID BM Lama, Gagan S, MD   237 mL at 04/03/24 0934   HYDROcodone-acetaminophen  (NORCO) 10-325 MG per tablet 1-2 tablet  1-2 tablet Oral Q6H PRN Sundil, Subrina, MD   1 tablet at 04/03/24 0001   melatonin tablet 5 mg  5 mg Oral QHS PRN Daniels, James K, NP   5 mg at 04/03/24 0001   nicotine  (NICODERM CQ  - dosed in mg/24 hours) patch 14 mg   14 mg Transdermal Daily Sundil, Subrina, MD   14 mg at 04/03/24 0934   ondansetron  (ZOFRAN ) tablet 4 mg  4 mg Oral Q6H PRN Sundil, Subrina, MD       Or   ondansetron  (ZOFRAN ) injection 4 mg  4 mg Intravenous Q6H PRN Sundil, Subrina, MD       polyvinyl alcohol  (LIQUIFILM TEARS) 1.4 % ophthalmic solution 1 drop  1 drop Both Eyes PRN Ozell Blunt, MD   1 drop at 04/03/24 1515   sodium bicarbonate tablet 650 mg  650 mg Oral BID Sundil, Subrina, MD   650 mg at 04/03/24 0934   sodium chloride  flush (NS) 0.9 % injection 3 mL  3 mL Intravenous Q12H Sundil, Subrina, MD   3 mL at 04/03/24 0935   sodium chloride  flush (NS) 0.9 % injection 3 mL  3 mL Intravenous Q12H Sundil, Subrina, MD   3 mL at 04/03/24 0934   sodium chloride  flush (NS) 0.9 % injection 3 mL  3 mL Intravenous PRN Sundil, Subrina, MD         Discharge Medications: Please see discharge summary for a list of discharge medications.  Relevant Imaging Results:  Relevant Lab Results:   Additional Information SSN:[id number]  Arta Lark Patryck Kilgore, LCSW

## 2024-04-03 NOTE — TOC Initial Note (Signed)
 Transition of Care [facility]) - Initial/Assessment Note    Patient Details  Name: Kristin Berry MRN: [id number] Date of Birth: 11-16-1941  [facility]) CM/SW Contact:    Gertha Ku, LCSW Phone Number: 04/03/2024, 2:56 PM  Clinical Narrative:                  CSW met with the pt to discuss recommendations for SNF placement. The pt requested that CSW speak with her son. CSW spoke with the pt's son, Alease Hunter, who agreed to the placement. CSW explained the process, noting that insurance authorization will be required. pt's son inquired whether the pt qualifies for Medicaid. CSW explained that the pt would need to apply for Medicaid through [facility]. Resource information has been added to the pt's AVS. CSW will fax the pt's information out for SNF placement. TOC to follow.  Expected Discharge Plan: Skilled Nursing Facility Barriers to Discharge: Continued Medical Work up, English as a second language teacher, SNF Pending bed offer   Patient Goals and CMS Choice            Expected Discharge Plan and Services       Living arrangements for the past 2 months: Skilled Nursing Facility                                      Prior Living Arrangements/Services Living arrangements for the past 2 months: Skilled Nursing Facility Lives with:: Self Patient language and need for interpreter reviewed:: Yes Do you feel safe going back to the place where you live?: Yes      Need for Family Participation in Patient Care: Yes (Comment) Care giver support system in place?: Yes (comment) Current home services: DME Criminal Activity/Legal Involvement Pertinent to Current Situation/Hospitalization: No - Comment as needed  Activities of Daily Living   ADL Screening (condition at time of admission) Independently performs ADLs?: No Does the patient have a NEW difficulty with bathing/dressing/toileting/self-feeding that is expected to last >3 days?: No Does the patient have a  NEW difficulty with getting in/out of bed, walking, or climbing stairs that is expected to last >3 days?: No Does the patient have a NEW difficulty with communication that is expected to last >3 days?: No Is the patient deaf or have difficulty hearing?: No Does the patient have difficulty seeing, even when wearing glasses/contacts?: No Does the patient have difficulty concentrating, remembering, or making decisions?: No  Permission Sought/Granted                  Emotional Assessment Appearance:: Appears stated age Attitude/Demeanor/Rapport: Gracious Affect (typically observed): Accepting Orientation: : Oriented to Self, Oriented to Place, Oriented to Situation   Psych Involvement: No (comment)  Admission diagnosis:  Confusion [R41.0] AKI (acute kidney injury) (HCC) [N17.9] Fall, initial encounter [W19.XXXA] Acute kidney injury superimposed on chronic kidney disease (HCC) [N17.9, N18.9] Closed fracture of hip with routine healing, unspecified laterality, subsequent encounter [S72.009D] Patient Active Problem List   Diagnosis Date Noted   Acute metabolic encephalopathy 03/31/2024   Recurrent falls 03/31/2024   Nondisplaced 9th and 12th rib fracture 03/31/2024   Closed fracture of multiple pubic rami, right, sequela 03/31/2024   Continuous dependence on cigarette smoking 03/31/2024   Metabolic acidosis 03/31/2024   Acute kidney injury superimposed on chronic kidney disease (HCC) 01/02/2022   Non-insulin  dependent type 2 diabetes mellitus (HCC) 01/02/2022   Hyperlipidemia 01/02/2022   Essential hypertension  01/02/2022   Stage 3b chronic kidney disease (CKD) (HCC) 01/02/2022   Tobacco dependence 01/02/2022   Ambulatory dysfunction 01/02/2022   UTI (urinary tract infection) 01/02/2022   Thyroid nodule 01/02/2022   DNR (do not resuscitate) 01/02/2022   Colles' fracture 01/02/2022   PCP:  Roselind Congo, MD Pharmacy:   [id number] - [place], [place] - [address] AT [facility] OF [facility] & [address] [place] [address] Phone: [phone] Fax: [phone]  [facility] #[id number] [place], [place] - [address] AT [address] [place] [address] [place] [address] Phone: [phone] Fax: [phone]     Social Drivers of Health (SDOH) Social History: SDOH Screenings   Food Insecurity: No Food Insecurity (03/31/2024)  Housing: Low Risk  (03/31/2024)  Transportation Needs: No Transportation Needs (03/31/2024)  Utilities: Not At Risk (03/31/2024)  Social Connections: Socially Isolated (03/31/2024)  Tobacco Use: High Risk (03/31/2024)   SDOH Interventions:     Readmission Risk Interventions     No data to display

## 2024-04-03 NOTE — Progress Notes (Signed)
 Mobility Specialist - Progress Note   04/03/24 1302  Mobility  Activity Ambulated with assistance in hallway  Level of Assistance Contact guard assist, steadying assist  Assistive Device Front wheel walker  Distance Ambulated (ft) 80 ft  Range of Motion/Exercises Active  Activity Response Tolerated well  Mobility Referral Yes  Mobility visit 1 Mobility  Mobility Specialist Start Time (ACUTE ONLY) 1245  Mobility Specialist Stop Time (ACUTE ONLY) 1300  Mobility Specialist Time Calculation (min) (ACUTE ONLY) 15 min   Pt was found in bed and agreeable to ambulate. Stated wanting eye drops and medication for constipation. RN notified. At EOS returned to recliner chair with all needs met. Call bell in reach and chair alarm on. RN in room.  Lorna Rose Mobility Specialist

## 2024-04-04 DIAGNOSIS — S32591S Other specified fracture of right pubis, sequela: Secondary | ICD-10-CM | POA: Diagnosis not present

## 2024-04-04 DIAGNOSIS — N179 Acute kidney failure, unspecified: Secondary | ICD-10-CM | POA: Diagnosis not present

## 2024-04-04 DIAGNOSIS — G9341 Metabolic encephalopathy: Secondary | ICD-10-CM | POA: Diagnosis not present

## 2024-04-04 DIAGNOSIS — F17210 Nicotine dependence, cigarettes, uncomplicated: Secondary | ICD-10-CM | POA: Diagnosis not present

## 2024-04-04 MED ORDER — POLYETHYLENE GLYCOL 3350 17 G PO PACK
17.0000 g | PACK | Freq: Every day | ORAL | Status: DC
  Administered 2024-04-04 – 2024-04-06 (×3): 17 g via ORAL
  Filled 2024-04-04 (×3): qty 1

## 2024-04-04 MED ORDER — BISACODYL 10 MG RE SUPP
10.0000 mg | Freq: Once | RECTAL | Status: AC
  Administered 2024-04-04: 10 mg via RECTAL
  Filled 2024-04-04: qty 1

## 2024-04-04 MED ORDER — AMLODIPINE BESYLATE 10 MG PO TABS
10.0000 mg | ORAL_TABLET | Freq: Every day | ORAL | Status: DC
  Administered 2024-04-05 – 2024-04-06 (×2): 10 mg via ORAL
  Filled 2024-04-04 (×2): qty 1

## 2024-04-04 MED ORDER — ALUM & MAG HYDROXIDE-SIMETH 200-200-20 MG/5ML PO SUSP
30.0000 mL | ORAL | Status: DC | PRN
  Administered 2024-04-04 – 2024-04-05 (×2): 30 mL via ORAL
  Filled 2024-04-04 (×2): qty 30

## 2024-04-04 MED ORDER — AMLODIPINE BESYLATE 10 MG PO TABS
5.0000 mg | ORAL_TABLET | Freq: Once | ORAL | Status: AC
  Administered 2024-04-04: 5 mg via ORAL
  Filled 2024-04-04: qty 1

## 2024-04-04 NOTE — Progress Notes (Signed)
 Physical Therapy Treatment Patient Details Name: Kristin Berry MRN: [id number] DOB: [birth year] Today's Date: 04/04/2024   History of Present Illness Pt is 83 y.o. female presented to emergency department on 03/31/24 for worsening weakness and confusion.  Patient was seen in the emergency department on 5/3 for recurrent fall workup revealed right-sided ninth rib fracture and an inferior pubic ramus fracture. During that time case was discussed with orthopedics Dr. Hermina Loosen who recommends weight bearing as tolerated for the inferior pubic ramus fx.  Patient was advised to be admitted for pain control and rehab placement however patient declined and eventually patient signed AMA as she wants to smoke cigarette.  Pt returned 5/9 after having 6-8 more falls at home and found to additionally have 12th rib fx. Pt with hx including but not limited to recurrent fall with chronic back pain, history of essential hypertension, CKD stage IV, hyperlipidemia, chronic smoking cigarette and DM type II    PT Comments  Pt making gradual progress with gait endurance but remains high fall risk.  She is easily distracted and needs mod/max cues for safety, focusing on task at hand, and safe transfer techniques.  She does live alone and has had several recent falls.  Continue to recommend Patient will benefit from continued inpatient follow up therapy, <3 hours/day at d/c.     If plan is discharge home, recommend the following: A little help with walking and/or transfers;A little help with bathing/dressing/bathroom;Assistance with cooking/housework;Assist for transportation;Help with stairs or ramp for entrance   Can travel by private vehicle     Yes  Equipment Recommendations  None recommended by PT    Recommendations for Other Services       Precautions / Restrictions Precautions Precautions: Fall     Mobility  Bed Mobility Overal bed mobility: Needs Assistance Bed Mobility: Supine to Sit, Sit to Supine      Supine to sit: HOB elevated, Used rails, Contact guard Sit to supine: Used rails, Contact guard assist, HOB elevated        Transfers Overall transfer level: Needs assistance Equipment used: Rolling walker (2 wheels) Transfers: Sit to/from Stand Sit to Stand: Min assist           General transfer comment: min cues hand placement and safety with RW    Ambulation/Gait Ambulation/Gait assistance: Min assist Gait Distance (Feet): 80 Feet Assistive device: Rolling walker (2 wheels) Gait Pattern/deviations: Step-through pattern, Decreased stride length, Trunk flexed Gait velocity: fluctuating speed slow to normal, frequently stops     General Gait Details: Min A for balance and managing RW; Frequent cues and assist to stay close to [employer] Mobility     Tilt Bed    Modified Rankin (Stroke Patients Only)       Balance Overall balance assessment: Needs assistance, History of Falls Sitting-balance support: Feet supported Sitting balance-Leahy Scale: Fair     Standing balance support: Bilateral upper extremity supported, Reliant on assistive device for balance Standing balance-Leahy Scale: Poor Standing balance comment: RW and min A                            Communication Communication Communication: Impaired Factors Affecting Communication: Hearing impaired  Cognition Arousal: Alert Behavior During Therapy: WFL for tasks assessed/performed   PT - Cognitive impairments: No family/caregiver present to determine baseline, Orientation, Awareness, Attention, Initiation, Sequencing, Problem solving,  Safety/Judgement                       PT - Cognition Comments: Pt requiring frequent multimodal cues throughout session.  She has decreased initiation needing cues for sequencing.  Pt also easily distracted - keeps focusing on white board and phone numbers in room despite attempts to redirect and focus on walking.   Once in hallway - pt still easily distracted by signs, people, etc.  States "I'm trying to match things."        Cueing    Exercises Other Exercises Other Exercises: Declined exercises or balance training-fatigued    General Comments General comments (skin integrity, edema, etc.): VSS      Pertinent Vitals/Pain Pain Assessment Pain Assessment: Faces Faces Pain Scale: Hurts a little bit Pain Location: lower legs Pain Descriptors / Indicators: Sore Pain Intervention(s): Limited activity within patient's tolerance, Monitored during session    Home Living                          Prior Function            PT Goals (current goals can now be found in the care plan section) Progress towards PT goals: Progressing toward goals    Frequency    Min 2X/week      PT Plan      Co-evaluation              AM-PAC PT "6 Clicks" Mobility   Outcome Measure  Help needed turning from your back to your side while in a flat bed without using bedrails?: A Little Help needed moving from lying on your back to sitting on the side of a flat bed without using bedrails?: A Little Help needed moving to and from a bed to a chair (including a wheelchair)?: A Lot (min A but mod/max cues for all below) Help needed standing up from a chair using your arms (e.g., wheelchair or bedside chair)?: A Lot Help needed to walk in hospital room?: A Lot Help needed climbing 3-5 steps with a railing? : A Lot 6 Click Score: 14    End of Session Equipment Utilized During Treatment: Gait belt Activity Tolerance: Patient tolerated treatment well Patient left: with call bell/phone within reach;in bed;with bed alarm set Nurse Communication: Mobility status PT Visit Diagnosis: Difficulty in walking, not elsewhere classified (R26.2);Unsteadiness on feet (R26.81)     Time: 4782-9562 PT Time Calculation (min) (ACUTE ONLY): 17 min  Charges:    $Gait Training: 8-22 mins PT General Charges $$  ACUTE PT VISIT: 1 Visit                     Cyd Dowse, PT Acute Rehab [facility] [phone]    Carolynn Citrin 04/04/2024, 4:47 PM

## 2024-04-04 NOTE — Progress Notes (Signed)
 Occupational Therapy Treatment Patient Details Name: Kristin Berry MRN: [id number] DOB: May 24, 1941 Today's Date: 04/04/2024   History of present illness "83 y.o. female with medical history significant of recurrent fall with chronic back pain, history of essential hypertension, CKD stage IV, hyperlipidemia, chronic smoking cigarette and DM type II presented to emergency department for worsening weakness and confusion.  Patient was seen in the emergency department on 5/3 for recurrent fall and in the ED further workup revealed right-sided ninth rib fracture and an inferior pubic ramus fracture. During that time case was discussed with orthopedics Dr. Hermina Loosen who recommends weight bearing as tolerated for the inferior pubic ramus fx.  Patient was advised to be admitted for pain control and rehab placement however patient declined and eventually patient signed AMA as she wants to smoke cigarette."   OT comments  Patient seen for skilled OT session this afternoon. Nursing present with family at start of session and had just transferred patient from recliner back to bed. Patient still open to all therapy activity presented. Addressed EOB and STS with RW with assisted functional reach, standing tolerance and balance excursions during light grooming. See below for status. Patient improving from initial evaluation and OT will continue to follow acutely for ongoing safety and functional gains. Patient will benefit from continued inpatient follow up therapy, <3 hours/day.        If plan is discharge home, recommend the following:  A little help with walking and/or transfers;A little help with bathing/dressing/bathroom   Equipment Recommendations  None recommended by OT       Precautions / Restrictions Precautions Precautions: Fall Restrictions Weight Bearing Restrictions Per Provider Order: No       Mobility Bed Mobility Overal bed mobility: Needs Assistance Bed Mobility: Supine to Sit, Sit to  Supine     Supine to sit: Contact guard Sit to supine: Contact guard assist, Used rails   General bed mobility comments: side stepping up to [facility] with CGA and RW    Transfers Overall transfer level: Needs assistance Equipment used: Rolling walker (2 wheels) Transfers: Sit to/from Stand Sit to Stand: Contact guard assist           General transfer comment: min cues hand placement and safety with RW     Balance Overall balance assessment: Needs assistance, History of Falls Sitting-balance support: Feet supported Sitting balance-Leahy Scale: Fair     Standing balance support: Single extremity supported, During functional activity, Reliant on assistive device for balance Standing balance-Leahy Scale: Poor Standing balance comment: reliant on UE support                           ADL either performed or assessed with clinical judgement   ADL Overall ADL's : Needs assistance/impaired     Grooming: Wash/dry hands;Wash/dry face;Oral care;Sitting;Standing;Contact guard assist Grooming Details (indicate cue type and reason): worked with patient on sitting and standing level with RW at bedside for light grooming, functioanl reach and balance                             Functional mobility during ADLs: Contact guard assist;Rolling walker (2 wheels) (STS x 4 with RW for light grooming) General ADL Comments: min cues for hand placement    Extremity/Trunk Assessment Upper Extremity Assessment Upper Extremity Assessment: Generalized weakness   Lower Extremity Assessment Lower Extremity Assessment: Generalized weakness  Communication Communication Communication: Impaired Factors Affecting Communication: Hearing impaired   Cognition Arousal: Alert                                   Following commands: Intact        Cueing   Cueing Techniques: Verbal cues        General Comments no SOB or skin issues noted     Pertinent Vitals/ Pain       Pain Assessment Pain Assessment: Faces Faces Pain Scale: No hurt   Frequency  Min 2X/week        Progress Toward Goals  OT Goals(current goals can now be found in the care plan section)  Progress towards OT goals: Progressing toward goals  Acute Rehab OT Goals Patient Stated Goal: to keep getting stronger OT Goal Formulation: With patient/family Time For Goal Achievement: 04/15/24 Potential to Achieve Goals: Good ADL Goals Pt Will Perform Grooming: with supervision;standing Pt Will Perform Lower Body Bathing: with supervision;sit to/from stand Pt Will Perform Lower Body Dressing: with supervision;sit to/from stand Pt Will Transfer to Toilet: with modified independence;ambulating Pt Will Perform Toileting - Clothing Manipulation and hygiene: with modified independence;sit to/from stand   AM-PAC OT "6 Clicks" Daily Activity     Outcome Measure   Help from another person eating meals?: None Help from another person taking care of personal grooming?: None Help from another person toileting, which includes using toliet, bedpan, or urinal?: A Little Help from another person bathing (including washing, rinsing, drying)?: A Little Help from another person to put on and taking off regular upper body clothing?: None Help from another person to put on and taking off regular lower body clothing?: A Little 6 Click Score: 21    End of Session Equipment Utilized During Treatment: Gait belt;Rolling walker (2 wheels)  OT Visit Diagnosis: Unsteadiness on feet (R26.81);History of falling (Z91.81);Repeated falls (R29.6);Muscle weakness (generalized) (M62.81)   Activity Tolerance Patient tolerated treatment well   Patient Left in bed;with call bell/phone within reach;with bed alarm set (nursing just transferred patient back to bed prior to OT visit)   Nurse Communication Mobility status        Time: 2536-6440 OT Time Calculation (min): 20  min  Charges: OT General Charges $OT Visit: 1 Visit OT Treatments $Self Care/Home Management : 8-22 mins  Sharni Negron OT/L Acute Rehabilitation Department  [phone]  04/04/2024, 4:26 PM

## 2024-04-04 NOTE — Plan of Care (Incomplete)
  Problem: Education: Goal: Knowledge of General Education information will improve Description: Including pain rating scale, medication(s)/side effects and non-pharmacologic comfort measures Outcome: Progressing   Problem: Health Behavior/Discharge Planning: Goal: Ability to manage health-related needs will improve Outcome: Progressing   Problem: Clinical Measurements: Goal: Ability to maintain clinical measurements within normal limits will improve Outcome: Progressing Goal: Diagnostic test results will improve Outcome: Progressing   Problem: Activity: Goal: Risk for activity intolerance will decrease Outcome: Progressing   Problem: Nutrition: Goal: Adequate nutrition will be maintained Outcome: Progressing   Problem: Coping: Goal: Level of anxiety will decrease Outcome: Progressing   Problem: Safety: Goal: Ability to remain free from injury will improve Outcome: Progressing   Problem: Clinical Measurements: Goal: Will remain free from infection Outcome: Adequate for Discharge Goal: Respiratory complications will improve Outcome: Adequate for Discharge Goal: Cardiovascular complication will be avoided Outcome: Adequate for Discharge   Problem: Elimination: Goal: Will not experience complications related to bowel motility Outcome: Adequate for Discharge Goal: Will not experience complications related to urinary retention Outcome: Adequate for Discharge   Problem: Skin Integrity: Goal: Risk for impaired skin integrity will decrease Outcome: Adequate for Discharge

## 2024-04-04 NOTE — TOC Progression Note (Signed)
 Transition of Care [facility]) - Progression Note    Patient Details  Name: Kristin Berry MRN: [id number] Date of Birth: 1941-09-28  [facility]) CM/SW Contact  Gertha Ku, LCSW Phone Number: 04/04/2024, 10:39 AM  Clinical Narrative:     CSW met with pt to presented bed offers.pt is requesting to speak with her son. CSW provided an update to pt's son. They are requesting time to review. TOC to follow.   Expected Discharge Plan: Skilled Nursing Facility Barriers to Discharge: Continued Medical Work up, English as a second language teacher, SNF Pending bed offer  Expected Discharge Plan and Services       Living arrangements for the past 2 months: Skilled Nursing Facility                                       Social Determinants of Health (SDOH) Interventions SDOH Screenings   Food Insecurity: No Food Insecurity (03/31/2024)  Housing: Low Risk  (03/31/2024)  Transportation Needs: No Transportation Needs (03/31/2024)  Utilities: Not At Risk (03/31/2024)  Social Connections: Socially Isolated (03/31/2024)  Tobacco Use: High Risk (03/31/2024)    Readmission Risk Interventions     No data to display

## 2024-04-04 NOTE — Progress Notes (Addendum)
 [facility]  PROGRESS NOTE  Takai Caul Smyth ION:[id number] DOB: 29-Jul-1941 DOA: 03/31/2024 PCP: Roselind Congo, MD   Brief HPI:    83 y.o. female with medical history significant of recurrent fall with chronic back pain, history of essential hypertension, CKD stage IV, hyperlipidemia, chronic smoking cigarette and DM type II presented to emergency department for worsening weakness and confusion. Patient was seen in the emergency department on 5/3 for recurrent fall and in the ED further workup revealed right-sided ninth rib fracture and an inferior pubic ramus fracture. During that time case was discussed with orthopedics Dr.Dr. Hermina Loosen who recommends weight bearing as tolerated for the inferior pubic ramus fx .  Patient was advised to be admitted for pain control and rehab placement however patient declined and eventually patient signed AMA as she wants to smoke cigarette CT head no active disease process. CT lumbar spine no acute fracture or malalignment.  Displaced fracture of the posterior 12th rib.  Renal and hepatic cyst  1.1 cm hyperattenuating lesion along the medial right kidney favored to reflect a cyst containing a hemorrhage or proteinaceous debris. Consider nonemergent correlation with renal ultrasound.   In the ED patient has been treated with 1 L of NS bolus.  This time patient agrees to be admitted to the hospital.   Hospitalist has been consulted for management of recurrent fall, AKI, hypercalcemia, acute metabolic encephalopathy, inferior MI fracture, nondisplaced right-sided 9th and 12th rib fractures.     Assessment/Plan:   Acute kidney injury superimposed CKD stage IV -Presented with creatinine of 2.25, BUN 73 - Improved with IV normal saline, today BUN/creatinine is 24/1.15 - At baseline  Acute metabolic encephalopathy-in the setting of AKI -Confusion improved with IV hydration - Continue delirium precaution   Metabolic acidosis - Low bicarb 12.  Normal  anion gap.  Metabolic acidosis in the setting of AKI on CKD stage IV. - Started on  oral bicarb 650 mg twice daily. -Bicarb has improved to 20  Hypercalcemia - Corrected calcium  11.3 - Will discontinue calcium  supplementation, was started on admission for pubic rami fracture - Follow serum calcium  in a.m.    Right-sided nondisplaced 9th and 12th rib fracture Right sided pelvic inferior rami fracture Recurrent fall She was seen in the ED 5/6 found to have nondisplaced right-sided rib fracture and right pelvic inferior rami fracture.  Orthopedics  consulted on 03/28/2024 recommended conservative management pain control, PT OT evaluation and rehab placement  -CT head no acute intracranial abnormality. - CT lumbar as no evidence of fracture.  However it showed right-sided 12 posteriorly fracture.  No evidence of pneumothorax. - Continue pain control, fall precaution. - Continue supportive care, spirometry and supplemental oxygen. - Consulted PT and OT for evaluation. - Patient likely will need skilled nursing facility for rehab   Sinus bradycardia - Holding atenolol  in the setting of bradycardia.  Continuous dependence on smoking -Continue nicotine  patch.  Counseled patient at bedside for smoking cessation   Essential hypertension -Blood pressure elevated after holding atenolol  - Improved after starting  amlodipine  5 mg daily - Will increase dose of amlodipine  to 10 mg daily   Hyperlipidemia -Continue Lipitor   Non-insulin -dependent DM type II History of DM type II diet managed.    Medications     [START ON 04/05/2024] amLODipine   10 mg Oral Daily   amLODipine   5 mg Oral Once   aspirin   81 mg Oral Daily   atorvastatin   40 mg Oral Daily   feeding supplement  237 mL Oral BID BM   nicotine   14 mg Transdermal Daily   polyethylene glycol  17 g Oral Daily   sodium bicarbonate  650 mg Oral BID   sodium chloride  flush  3 mL Intravenous Q12H   sodium chloride  flush  3 mL  Intravenous Q12H     Data Reviewed:   CBG:  Recent Labs  Lab 03/31/24 2220  GLUCAP 108*    SpO2: 99 %    Vitals:   04/03/24 2153 04/04/24 0613 04/04/24 0959 04/04/24 1216  BP: 134/65 (!) 165/58 (!) 159/53 135/68  Pulse: 74 61 79 71  Resp: 20 (!) 22 20 20   Temp: 98 F (36.7 C) 97.6 F (36.4 C) 98 F (36.7 C) 98.1 F (36.7 C)  TempSrc: Oral Oral Oral Oral  SpO2: 99% 99% 100% 99%  Weight:      Height:          Data Reviewed:  Basic Metabolic Panel: Recent Labs  Lab 03/31/24 1829 04/01/24 0427 04/02/24 0435 04/03/24 0920  NA 136 138 135 138  K 4.5 4.0 3.6 3.7  CL 112* 115* 110 111  CO2 12* 16* 17* 20*  GLUCOSE 129* 157* 207* 156*  BUN 73* 58* 37* 24*  CREATININE 2.25* 1.62* 1.44* 1.15*  CALCIUM  10.6* 10.3 10.6* 10.8*    CBC: Recent Labs  Lab 03/31/24 1829 04/01/24 0427 04/02/24 0435  WBC 6.9 6.1 4.8  NEUTROABS 5.4  --   --   HGB 11.9* 11.2* 10.3*  HCT 36.1 33.8* 32.3*  MCV 96.8 97.4 97.6  PLT 218 189 172    LFT Recent Labs  Lab 04/02/24 0435  AST 21  ALT 21  ALKPHOS 96  BILITOT 0.4  PROT 5.6*  ALBUMIN 3.1*     Antibiotics: Anti-infectives (From admission, onward)    None        DVT prophylaxis: SCDs  Code Status: Full code  Family Communication: Discussed with patient's son on phone   CONSULTS    Subjective   Denies pain.   Objective    Physical Examination:  Appears in no acute distress S1-S2, regular Lungs clear to auscultation bilaterally Abdomen is soft, nontender, organomegaly Alert, oriented x 3, no focal deficit noted   Status is: Inpatient:             Ozell Blunt   [facility] If 7PM-7AM, please contact night-coverage at [email or web address], Office  [phone]   04/04/2024, 4:54 PM  LOS: 4 days

## 2024-04-05 DIAGNOSIS — N189 Chronic kidney disease, unspecified: Secondary | ICD-10-CM | POA: Diagnosis not present

## 2024-04-05 DIAGNOSIS — N179 Acute kidney failure, unspecified: Secondary | ICD-10-CM | POA: Diagnosis not present

## 2024-04-05 LAB — COMPREHENSIVE METABOLIC PANEL WITH GFR
ALT: 23 U/L (ref 0–44)
AST: 22 U/L (ref 15–41)
Albumin: 3.2 g/dL — ABNORMAL LOW (ref 3.5–5.0)
Alkaline Phosphatase: 118 U/L (ref 38–126)
Anion gap: 8 (ref 5–15)
BUN: 27 mg/dL — ABNORMAL HIGH (ref 8–23)
CO2: 22 mmol/L (ref 22–32)
Calcium: 10.5 mg/dL — ABNORMAL HIGH (ref 8.9–10.3)
Chloride: 106 mmol/L (ref 98–111)
Creatinine, Ser: 1.18 mg/dL — ABNORMAL HIGH (ref 0.44–1.00)
GFR, Estimated: 46 mL/min — ABNORMAL LOW (ref >60)
Glucose, Bld: 153 mg/dL — ABNORMAL HIGH (ref 70–99)
Potassium: 4.4 mmol/L (ref 3.5–5.1)
Sodium: 136 mmol/L (ref 135–145)
Total Bilirubin: 0.7 mg/dL (ref 0.0–1.2)
Total Protein: 6.1 g/dL — ABNORMAL LOW (ref 6.5–8.1)

## 2024-04-05 LAB — CBC
HCT: 31.8 % — ABNORMAL LOW (ref 36.0–46.0)
Hemoglobin: 10.3 g/dL — ABNORMAL LOW (ref 12.0–15.0)
MCH: 31.5 pg (ref 26.0–34.0)
MCHC: 32.4 g/dL (ref 30.0–36.0)
MCV: 97.2 fL (ref 80.0–100.0)
Platelets: 182 10*3/uL (ref 150–400)
RBC: 3.27 MIL/uL — ABNORMAL LOW (ref 3.87–5.11)
RDW: 12.9 % (ref 11.5–15.5)
WBC: 6.1 10*3/uL (ref 4.0–10.5)
nRBC: 0 % (ref 0.0–0.2)

## 2024-04-05 MED ORDER — TRAZODONE HCL 50 MG PO TABS
25.0000 mg | ORAL_TABLET | Freq: Once | ORAL | Status: AC
  Administered 2024-04-05: 25 mg via ORAL
  Filled 2024-04-05: qty 1

## 2024-04-05 NOTE — Progress Notes (Signed)
 PROGRESS NOTE    Kristin Berry  WUJ:[id number] DOB: [birth year] DOA: 03/31/2024 PCP: Roselind Congo, MD     Brief Narrative:  Kristin Berry is an 83 y.o. female with medical history significant of recurrent fall with chronic back pain, history of essential hypertension, CKD stage IV, hyperlipidemia, chronic smoking cigarette and DM type II presented to emergency department for worsening weakness and confusion.  Patient was seen in the emergency department on 5/3 for recurrent fall and in the ED further workup revealed right-sided ninth rib fracture and an inferior pubic ramus fracture. During that time case was discussed with orthopedics Dr. Hermina Loosen who recommended weight bearing as tolerated for the inferior pubic ramus fx .  Patient was advised to be admitted for pain control and rehab placement however patient declined and eventually patient signed AMA as she wants to smoke cigarette  CT head no active disease process. CT lumbar spine no acute fracture or malalignment.  Displaced fracture of the posterior 12th rib.  Renal and hepatic cyst  1.1 cm hyperattenuating lesion along the medial right kidney favored to reflect a cyst containing a hemorrhage or proteinaceous debris. Consider nonemergent correlation with renal ultrasound.   In the ED patient has been treated with 1 L of NS bolus.  This time patient agrees to be admitted to the hospital.   Hospitalist has been consulted for management of recurrent fall, AKI, hypercalcemia, acute metabolic encephalopathy, inferior rami fracture, nondisplaced right-sided 9th and 12th rib fractures.   New events last 24 hours / Subjective: States that she is doing well overall.  Currently awaiting SNF placement.  Doing well with PT.  Assessment & Plan:  Principal Problem:   Acute kidney injury superimposed on chronic kidney disease (HCC) Active Problems:   Acute metabolic encephalopathy   Non-insulin  dependent type 2 diabetes mellitus (HCC)    Hyperlipidemia   Essential hypertension   Recurrent falls   Nondisplaced 9th and 12th rib fracture   Closed fracture of multiple pubic rami, right, sequela   Continuous dependence on cigarette smoking   Metabolic acidosis   Right-sided nondisplaced 9th and 12th rib fracture Right-sided pelvic inferior rami fracture Recurrent fall - Orthopedic surgery recommended conservative management, pain control, PT OT - SNF placement pending   AKI on CKD stage 3a - Presented with creatinine 2.25 - Improved, now at baseline  Acute metabolic encephalopathy - Resolved  Metabolic acidosis - Resolved   Hypercalcemia - Insetting of calcium  supplementation.  Improved  Sinus bradycardia - Atenolol  on hold  Tobacco abuse - Nicotine  patch  Hypertension - Norvasc   Hyperlipidemia - Lipitor   DVT prophylaxis:  SCDs Start: 03/31/24 2123 Place TED hose Start: 03/31/24 2123  Code Status: Full Family Communication: None at bedside  Disposition Plan: SNF  Status is: Inpatient Remains inpatient appropriate because: SNF placement pending     Antimicrobials:  Anti-infectives (From admission, onward)    None        Objective: Vitals:   04/04/24 1216 04/04/24 2034 04/05/24 0251 04/05/24 1300  BP: 135/68 (!) 143/56 (!) 134/52 122/68  Pulse: 71 73 63 68  Resp: 20 20 14    Temp: 98.1 F (36.7 C) 99.2 F (37.3 C) 98.2 F (36.8 C) 98.6 F (37 C)  TempSrc: Oral Oral Oral Oral  SpO2: 99% 98% 98% 99%  Weight:      Height:        Intake/Output Summary (Last 24 hours) at 04/05/2024 1411 Last data filed at 04/05/2024 1345 Gross per 24  hour  Intake 720 ml  Output 50 ml  Net 670 ml   Filed Weights   03/31/24 1407  Weight: 45.4 kg    Examination:  General exam: Appears calm and comfortable  Respiratory system: Clear to auscultation. Respiratory effort normal. No respiratory distress. No conversational dyspnea.  Cardiovascular system: S1 & S2 heard, RRR. No murmurs. No pedal  edema. Gastrointestinal system: Abdomen is nondistended, soft and nontender. Normal bowel sounds heard. Central nervous system: Alert and oriented. No focal neurological deficits. Speech clear.  Extremities: Symmetric in appearance  Skin: No rashes, lesions or ulcers on exposed skin  Psychiatry: Judgement and insight appear normal. Mood & affect appropriate.   Data Reviewed: I have personally reviewed following labs and imaging studies  CBC: Recent Labs  Lab 03/31/24 1829 04/01/24 0427 04/02/24 0435 04/05/24 0414  WBC 6.9 6.1 4.8 6.1  NEUTROABS 5.4  --   --   --   HGB 11.9* 11.2* 10.3* 10.3*  HCT 36.1 33.8* 32.3* 31.8*  MCV 96.8 97.4 97.6 97.2  PLT 218 189 172 182   Basic Metabolic Panel: Recent Labs  Lab 03/31/24 1829 04/01/24 0427 04/02/24 0435 04/03/24 0920 04/05/24 0414  NA 136 138 135 138 136  K 4.5 4.0 3.6 3.7 4.4  CL 112* 115* 110 111 106  CO2 12* 16* 17* 20* 22  GLUCOSE 129* 157* 207* 156* 153*  BUN 73* 58* 37* 24* 27*  CREATININE 2.25* 1.62* 1.44* 1.15* 1.18*  CALCIUM  10.6* 10.3 10.6* 10.8* 10.5*   GFR: Estimated Creatinine Clearance: 26.3 mL/min (A) (by C-G formula based on SCr of 1.18 mg/dL (H)). Liver Function Tests: Recent Labs  Lab 04/02/24 0435 04/05/24 0414  AST 21 22  ALT 21 23  ALKPHOS 96 118  BILITOT 0.4 0.7  PROT 5.6* 6.1*  ALBUMIN 3.1* 3.2*   No results for input(s): "LIPASE", "AMYLASE" in the last 168 hours. No results for input(s): "AMMONIA" in the last 168 hours. Coagulation Profile: No results for input(s): "INR", "PROTIME" in the last 168 hours. Cardiac Enzymes: No results for input(s): "CKTOTAL", "CKMB", "CKMBINDEX", "TROPONINI" in the last 168 hours. BNP (last 3 results) No results for input(s): "PROBNP" in the last 8760 hours. HbA1C: No results for input(s): "HGBA1C" in the last 72 hours. CBG: Recent Labs  Lab 03/31/24 2220  GLUCAP 108*   Lipid Profile: No results for input(s): "CHOL", "HDL", "LDLCALC", "TRIG",  "CHOLHDL", "LDLDIRECT" in the last 72 hours. Thyroid Function Tests: No results for input(s): "TSH", "T4TOTAL", "FREET4", "T3FREE", "THYROIDAB" in the last 72 hours. Anemia Panel: No results for input(s): "VITAMINB12", "FOLATE", "FERRITIN", "TIBC", "IRON", "RETICCTPCT" in the last 72 hours. Sepsis Labs: No results for input(s): "PROCALCITON", "LATICACIDVEN" in the last 168 hours.  No results found for this or any previous visit (from the past 240 hours).    Radiology Studies: No results found.    Scheduled Meds:  amLODipine   10 mg Oral Daily   aspirin   81 mg Oral Daily   atorvastatin   40 mg Oral Daily   feeding supplement  237 mL Oral BID BM   nicotine   14 mg Transdermal Daily   polyethylene glycol  17 g Oral Daily   sodium bicarbonate  650 mg Oral BID   sodium chloride  flush  3 mL Intravenous Q12H   sodium chloride  flush  3 mL Intravenous Q12H   Continuous Infusions:   LOS: 5 days   Time spent: 30 minutes   Daren Eck, DO [facility] 04/05/2024, 2:11 PM  Available via Epic secure chat 7am-7pm After these hours, please refer to coverage provider listed on [email or web address]

## 2024-04-05 NOTE — TOC Progression Note (Signed)
 Transition of Care [facility]) - Progression Note    Patient Details  Name: Kristin Berry MRN: [id number] Date of Birth: 24-Jun-1941  [facility]) CM/SW Contact  Gertha Ku, LCSW Phone Number: 04/05/2024, 1:40 PM  Clinical Narrative:     CSW spoke with pt's son , they have chosen [facility] for SNF placement. CSW to start insurance authorization. TOC to follow.   Expected Discharge Plan: Skilled Nursing Facility Barriers to Discharge: Continued Medical Work up, English as a second language teacher, SNF Pending bed offer  Expected Discharge Plan and Services       Living arrangements for the past 2 months: Skilled Nursing Facility                                       Social Determinants of Health (SDOH) Interventions SDOH Screenings   Food Insecurity: No Food Insecurity (03/31/2024)  Housing: Low Risk  (03/31/2024)  Transportation Needs: No Transportation Needs (03/31/2024)  Utilities: Not At Risk (03/31/2024)  Social Connections: Socially Isolated (03/31/2024)  Tobacco Use: High Risk (03/31/2024)    Readmission Risk Interventions     No data to display

## 2024-04-05 NOTE — Care Management Important Message (Signed)
 Important Message  Patient Details  Name: Kristin Berry MRN: [id number] Date of Birth: 12-Dec-1940   Important Message Given:        Peyton Brash 04/05/2024, 1:25 PM

## 2024-04-05 NOTE — Progress Notes (Signed)
 Occupational Therapy Treatment Patient Details Name: Kristin Berry MRN: [id number] DOB: [birth year] Today's Date: 04/05/2024   History of present illness Pt is 83 yr old female presented to emergency department on 03/31/24 for worsening weakness and confusion.  Patient was seen in the emergency department on 5/3 for recurrent fall workup revealed right-sided ninth rib fracture and an inferior pubic ramus fracture. During that time case was discussed with orthopedics Dr. Hermina Loosen who recommends weight bearing as tolerated for the inferior pubic ramus fx.  Patient was advised to be admitted for pain control and rehab placement however patient declined and eventually patient signed AMA as she wants to smoke cigarette.  Pt returned 5/9 after having 6-8 more falls at home and found to additionally have 12th rib fx. Pt with hx including but not limited to recurrent fall with chronic back pain, history of essential hypertension, CKD stage IV, hyperlipidemia, chronic smoking cigarette and DM type II   OT comments  The pt presented with good participation in the session. ADL instruction was provided for toileting at bathroom level and grooming in standing at the sink. She required CGA overall with occasional verbal cues for safety and attention to tasks. She denied having pain throughout the entirety of the session. OT anticipates she would have difficulty managing all of her self care tasks and IADLs in the home, as she does live alone. As such, short term SNF rehab may be warranted to maximize her functional independence prior to her return home.       If plan is discharge home, recommend the following:  A little help with walking and/or transfers;A little help with bathing/dressing/bathroom;Direct supervision/assist for medications management   Equipment Recommendations  None recommended by OT    Recommendations for Other Services      Precautions / Restrictions Precautions Precautions:  Fall Restrictions Weight Bearing Restrictions Per Provider Order: No       Mobility Bed Mobility Overal bed mobility: Needs Assistance Bed Mobility: Supine to Sit, Sit to Supine     Supine to sit: Supervision, HOB elevated, Used rails Sit to supine: Supervision        Transfers Overall transfer level: Needs assistance Equipment used: Rolling walker (2 wheels) Transfers: Sit to/from Stand Sit to Stand: Contact guard assist                     ADL either performed or assessed with clinical judgement   ADL Overall ADL's : Needs assistance/impaired     Grooming: Wash/dry hands;Cueing for safety;Standing Grooming Details (indicate cue type and reason): The pt stood at the sink to perform hand washing.                 Toilet Transfer: Cueing for safety;Rolling walker (2 wheels);Ambulation;Regular Teacher, adult education Details (indicate cue type and reason): The pt ambulated to and from the bathroom in her room using a RW. Toileting- Clothing Manipulation and Hygiene: Contact guard assist;Sit to/from stand;Cueing for safety Toileting - Clothing Manipulation Details (indicate cue type and reason): Toileting tasks performed with the pt at bathroom level.                      Communication Communication Factors Affecting Communication: Hearing impaired   Cognition Arousal: Alert Behavior During Therapy: WFL for tasks assessed/performed            Following commands: Intact  Pertinent Vitals/ Pain       Pain Assessment Pain Assessment: No/denies pain   Frequency  Min 2X/week        Progress Toward Goals  OT Goals(current goals can now be found in the care plan section)  Progress towards OT goals: Progressing toward goals  Acute Rehab OT Goals OT Goal Formulation: With patient Time For Goal Achievement: 04/15/24 Potential to Achieve Goals: Good  Plan         AM-PAC OT "6 Clicks" Daily Activity      Outcome Measure   Help from another person eating meals?: None Help from another person taking care of personal grooming?: None Help from another person toileting, which includes using toliet, bedpan, or urinal?: A Little Help from another person bathing (including washing, rinsing, drying)?: A Little Help from another person to put on and taking off regular upper body clothing?: A Little Help from another person to put on and taking off regular lower body clothing?: A Little 6 Click Score: 20    End of Session Equipment Utilized During Treatment: Rolling walker (2 wheels)  OT Visit Diagnosis: Unsteadiness on feet (R26.81);History of falling (Z91.81);Muscle weakness (generalized) (M62.81)   Activity Tolerance Patient tolerated treatment well   Patient Left in bed;with call bell/phone within reach;with bed alarm set   Nurse Communication Mobility status        Time: 1610-9604 OT Time Calculation (min): 10 min  Charges: OT General Charges $OT Visit: 1 Visit OT Treatments $Self Care/Home Management : 8-22 mins    Sheralyn Dies, OTR/L 04/05/2024, 10:11 PM

## 2024-04-05 NOTE — Progress Notes (Signed)
 Mobility Specialist - Progress Note   04/05/24 1121  Mobility  Activity Ambulated with assistance in hallway  Level of Assistance Contact guard assist, steadying assist  Assistive Device Front wheel walker  Distance Ambulated (ft) 240 ft  Activity Response Tolerated well  Mobility Referral Yes  Mobility visit 1 Mobility  Mobility Specialist Start Time (ACUTE ONLY) 1107  Mobility Specialist Stop Time (ACUTE ONLY) 1117  Mobility Specialist Time Calculation (min) (ACUTE ONLY) 10 min   Pt received in bed and agreeable to mobility. No complaints during session. Pt to recliner after session with all needs met.   [facility]

## 2024-04-06 DIAGNOSIS — Z7401 Bed confinement status: Secondary | ICD-10-CM | POA: Diagnosis not present

## 2024-04-06 DIAGNOSIS — R5381 Other malaise: Secondary | ICD-10-CM | POA: Diagnosis not present

## 2024-04-06 DIAGNOSIS — N1832 Chronic kidney disease, stage 3b: Secondary | ICD-10-CM | POA: Diagnosis not present

## 2024-04-06 DIAGNOSIS — G9341 Metabolic encephalopathy: Secondary | ICD-10-CM | POA: Diagnosis not present

## 2024-04-06 DIAGNOSIS — R1313 Dysphagia, pharyngeal phase: Secondary | ICD-10-CM | POA: Diagnosis not present

## 2024-04-06 DIAGNOSIS — S32591S Other specified fracture of right pubis, sequela: Secondary | ICD-10-CM | POA: Diagnosis not present

## 2024-04-06 DIAGNOSIS — E119 Type 2 diabetes mellitus without complications: Secondary | ICD-10-CM | POA: Diagnosis not present

## 2024-04-06 DIAGNOSIS — S32599A Other specified fracture of unspecified pubis, initial encounter for closed fracture: Secondary | ICD-10-CM | POA: Diagnosis not present

## 2024-04-06 DIAGNOSIS — I129 Hypertensive chronic kidney disease with stage 1 through stage 4 chronic kidney disease, or unspecified chronic kidney disease: Secondary | ICD-10-CM | POA: Diagnosis not present

## 2024-04-06 DIAGNOSIS — K5900 Constipation, unspecified: Secondary | ICD-10-CM | POA: Diagnosis not present

## 2024-04-06 DIAGNOSIS — M6281 Muscle weakness (generalized): Secondary | ICD-10-CM | POA: Diagnosis not present

## 2024-04-06 DIAGNOSIS — N1831 Chronic kidney disease, stage 3a: Secondary | ICD-10-CM | POA: Diagnosis not present

## 2024-04-06 DIAGNOSIS — N189 Chronic kidney disease, unspecified: Secondary | ICD-10-CM | POA: Diagnosis not present

## 2024-04-06 DIAGNOSIS — N179 Acute kidney failure, unspecified: Secondary | ICD-10-CM | POA: Diagnosis not present

## 2024-04-06 DIAGNOSIS — M25559 Pain in unspecified hip: Secondary | ICD-10-CM | POA: Diagnosis not present

## 2024-04-06 DIAGNOSIS — I10 Essential (primary) hypertension: Secondary | ICD-10-CM | POA: Diagnosis not present

## 2024-04-06 DIAGNOSIS — S32810D Multiple fractures of pelvis with stable disruption of pelvic ring, subsequent encounter for fracture with routine healing: Secondary | ICD-10-CM | POA: Diagnosis not present

## 2024-04-06 DIAGNOSIS — R262 Difficulty in walking, not elsewhere classified: Secondary | ICD-10-CM | POA: Diagnosis not present

## 2024-04-06 DIAGNOSIS — S2231XA Fracture of one rib, right side, initial encounter for closed fracture: Secondary | ICD-10-CM | POA: Diagnosis not present

## 2024-04-06 DIAGNOSIS — E559 Vitamin D deficiency, unspecified: Secondary | ICD-10-CM | POA: Diagnosis not present

## 2024-04-06 DIAGNOSIS — F17210 Nicotine dependence, cigarettes, uncomplicated: Secondary | ICD-10-CM | POA: Diagnosis not present

## 2024-04-06 DIAGNOSIS — Z743 Need for continuous supervision: Secondary | ICD-10-CM | POA: Diagnosis not present

## 2024-04-06 DIAGNOSIS — E872 Acidosis, unspecified: Secondary | ICD-10-CM | POA: Diagnosis not present

## 2024-04-06 DIAGNOSIS — S22079D Unspecified fracture of T9-T10 vertebra, subsequent encounter for fracture with routine healing: Secondary | ICD-10-CM | POA: Diagnosis not present

## 2024-04-06 DIAGNOSIS — R296 Repeated falls: Secondary | ICD-10-CM | POA: Diagnosis not present

## 2024-04-06 DIAGNOSIS — N171 Acute kidney failure with acute cortical necrosis: Secondary | ICD-10-CM | POA: Diagnosis not present

## 2024-04-06 DIAGNOSIS — E785 Hyperlipidemia, unspecified: Secondary | ICD-10-CM | POA: Diagnosis not present

## 2024-04-06 DIAGNOSIS — R001 Bradycardia, unspecified: Secondary | ICD-10-CM | POA: Diagnosis not present

## 2024-04-06 DIAGNOSIS — S2241XA Multiple fractures of ribs, right side, initial encounter for closed fracture: Secondary | ICD-10-CM | POA: Diagnosis not present

## 2024-04-06 MED ORDER — HYDROCODONE-ACETAMINOPHEN 10-325 MG PO TABS: 1.0000 | ORAL_TABLET | Freq: Four times a day (QID) | ORAL | 0 refills | Status: AC | PRN

## 2024-04-06 MED ORDER — DOCUSATE SODIUM 100 MG PO CAPS: 100.0000 mg | ORAL_CAPSULE | Freq: Two times a day (BID) | ORAL | 0 refills | Status: AC | PRN

## 2024-04-06 MED ORDER — POLYETHYLENE GLYCOL 3350 17 G PO PACK
17.0000 g | PACK | Freq: Every day | ORAL | 0 refills | Status: AC
Start: 2024-04-06 — End: ?

## 2024-04-06 MED ORDER — NICOTINE 14 MG/24HR TD PT24: 14.0000 mg | MEDICATED_PATCH | Freq: Every day | TRANSDERMAL | 0 refills | Status: AC

## 2024-04-06 NOTE — TOC Transition Note (Signed)
 Transition of Care [facility]) - Discharge Note   Patient Details  Name: Kristin Berry MRN: [id number] Date of Birth: Jul 25, 1941  [facility] CM/SW Contact:  Gertha Ku, LCSW Phone Number: 04/06/2024, 10:42 AM   Clinical Narrative:     Pt's insurance [employer] was approved for [facility] WU;[id number] 04/06/2024-04/10/2024. CSW spoke with pt's son to inform him about transfer. Pt's room 122 B, RN to call report to [phone]. TOC sign off.     Final next level of care: Skilled Nursing Facility Barriers to Discharge: Barriers Resolved   Patient Goals and CMS Choice Patient states their goals for this hospitalization and ongoing recovery are:: SNF to get stonger          Discharge Placement              Patient chooses bed at:  [facility]) Patient to be transferred to facility by: EMS Name of family member notified: [name] (Son)  [phone] [facility]) Patient and family notified of of transfer: 04/06/24  Discharge Plan and Services Additional resources added to the After Visit Summary for                                       Social Drivers of Health (SDOH) Interventions SDOH Screenings   Food Insecurity: No Food Insecurity (03/31/2024)  Housing: Low Risk  (03/31/2024)  Transportation Needs: No Transportation Needs (03/31/2024)  Utilities: Not At Risk (03/31/2024)  Social Connections: Socially Isolated (03/31/2024)  Tobacco Use: High Risk (03/31/2024)     Readmission Risk Interventions     No data to display

## 2024-04-06 NOTE — Progress Notes (Signed)
 Patient received discharge orders to go to [facility], room 122 B. RN called report on patient over to [facility]. [facility] was called, and discharge paperwork packet was put together for [facility] to pick up when picking up patient.  At 1409, [facility] came to pick up patient to take patient to [facility], room 122 B. Patient left the hospital stable, had discharge paperwork packet, and had all personal belongings. RN called patient's son to inform him that patient had been picked up by [facility] to take patient to [facility], room 122 B.

## 2024-04-06 NOTE — Discharge Summary (Signed)
 Physician Discharge Summary  Kristin Berry HDQ:[id number] DOB: 02-12-1941 DOA: 03/31/2024  PCP: Roselind Congo, MD  Admit date: 03/31/2024 Discharge date: 04/06/2024  Admitted From: Home Disposition:  SNF   Recommendations for Outpatient Follow-up:  Follow up with PCP Follow up with orthopedic surgery Dr. Hermina Loosen 1.1 cm hyperattenuating lesion along the medial right kidney favored to reflect a cyst containing a hemorrhage or proteinaceous debris. Consider nonemergent correlation with renal ultrasound.   Discharge Condition: Stable CODE STATUS: Full  Diet recommendation:  Diet Orders (From admission, onward)     Start     Ordered   03/31/24 2123  Diet heart healthy/carb modified Room service appropriate? Yes; Fluid consistency: Thin  Diet effective now       Question Answer Comment  Diet-HS Snack? Nothing   Room service appropriate? Yes   Fluid consistency: Thin      03/31/24 2122           Brief/Interim Summary: Kristin Berry is an 83 y.o. female with medical history significant of recurrent fall with chronic back pain, history of essential hypertension, CKD stage IV, hyperlipidemia, chronic smoking cigarette and DM type II presented to emergency department for worsening weakness and confusion.   Patient was seen in the emergency department on 5/3 for recurrent fall and in the ED further workup revealed right-sided ninth rib fracture and an inferior pubic ramus fracture. During that time case was discussed with orthopedics Dr. Hermina Loosen who recommended weight bearing as tolerated for the inferior pubic ramus fx .  Patient was advised to be admitted for pain control and rehab placement however patient declined and eventually patient signed AMA as she wants to smoke cigarette.    CT head no active disease process. CT lumbar spine no acute fracture or malalignment.  Displaced fracture of the posterior 12th rib.  Renal and hepatic cyst  1.1 cm hyperattenuating lesion along the  medial right kidney favored to reflect a cyst containing a hemorrhage or proteinaceous debris. Consider nonemergent correlation with renal ultrasound.   In the ED patient has been treated with 1 L of NS bolus.  This time patient agrees to be admitted to the hospital.   Hospitalist has been consulted for management of recurrent fall, AKI, hypercalcemia, acute metabolic encephalopathy, inferior rami fracture, nondisplaced right-sided 9th and 12th rib fractures.   Her medical conditioned improved and she was recommended to discharge to SNF. She will follow up with orthopedic surgery for her inferior pubic ramus fracture.   Discharge Diagnoses:   Principal Problem:   Acute kidney injury superimposed on chronic kidney disease (HCC) Active Problems:   Acute metabolic encephalopathy   Non-insulin  dependent type 2 diabetes mellitus (HCC)   Hyperlipidemia   Essential hypertension   Recurrent falls   Nondisplaced 9th and 12th rib fracture   Closed fracture of multiple pubic rami, right, sequela   Continuous dependence on cigarette smoking   Metabolic acidosis    Right-sided nondisplaced 9th and 12th rib fracture Right-sided pelvic inferior rami fracture Recurrent fall - Orthopedic surgery recommended conservative management, pain control, PT OT - SNF placement recommended    AKI on CKD stage 3a - Presented with creatinine 2.25 - Improved, now at baseline creatinine 1.18    Acute metabolic encephalopathy - Resolved   Metabolic acidosis - Resolved    Hypercalcemia - Insetting of calcium  supplementation.  Improved   Sinus bradycardia - Atenolol  on hold - Bradycardia improved    Tobacco abuse - Nicotine  patch   Hypertension -  Norvasc    Hyperlipidemia - Lipitor    Discharge Instructions  Discharge Instructions     Increase activity slowly   Complete by: As directed    No wound care   Complete by: As directed       Allergies as of 04/06/2024       Reactions    Codeine Nausea And Vomiting   Tramadol Hcl Itching        Medication List     STOP taking these medications    atenolol  50 MG tablet Commonly known as: TENORMIN        TAKE these medications    acetaminophen  650 MG CR tablet Commonly known as: TYLENOL  Take 1,300 mg by mouth every 8 (eight) hours as needed for pain.   amLODipine  5 MG tablet Commonly known as: NORVASC  Take 5 mg by mouth daily.   aspirin  81 MG chewable tablet Chew 162 mg by mouth daily.   atorvastatin  40 MG tablet Commonly known as: LIPITOR Take 40 mg by mouth daily.   carboxymethylcellulose 0.5 % Soln Commonly known as: REFRESH PLUS Place 2-3 drops into both eyes as needed (dry eyes).   cholecalciferol 25 MCG (1000 UNIT) tablet Commonly known as: VITAMIN D3 Take 1,000 Units by mouth daily.   docusate sodium  100 MG capsule Commonly known as: COLACE Take 1 capsule (100 mg total) by mouth 2 (two) times daily as needed for mild constipation. What changed:  when to take this reasons to take this   HYDROcodone-acetaminophen  10-325 MG tablet Commonly known as: NORCO Take 1-2 tablets by mouth every 6 (six) hours as needed for severe pain (pain score 7-10) or moderate pain (pain score 4-6). What changed:  how much to take reasons to take this   metFORMIN 500 MG tablet Commonly known as: GLUCOPHAGE Take 500 mg by mouth daily.   nicotine  14 mg/24hr patch Commonly known as: NICODERM CQ  - dosed in mg/24 hours Place 1 patch (14 mg total) onto the skin daily.   polyethylene glycol 17 g packet Commonly known as: MIRALAX  / GLYCOLAX  Take 17 g by mouth daily.   torsemide 10 MG tablet Commonly known as: DEMADEX Take 10 mg by mouth daily.        Contact information for follow-up providers     Roselind Congo, MD Follow up.   Specialty: Family Medicine Contact information: [phone] [address] [place] [address] [phone]         Wilhelmenia Harada, MD. Schedule an  appointment as soon as possible for a visit in 1 week(s).   Specialty: Orthopedic Surgery Contact information: [address] [place] [address] [phone]              Contact information for after-discharge care     Destination     [facility] .   Service: Skilled Nursing Contact information: [address] [place]  [address] [phone]                    Allergies  Allergen Reactions   Codeine Nausea And Vomiting   Tramadol Hcl Itching     Procedures/Studies: CT Lumbar Spine Wo Contrast Result Date: 03/31/2024 CLINICAL DATA:  Trauma, multiple falls.  Known pubic ramus fracture. EXAM: CT LUMBAR SPINE WITHOUT CONTRAST TECHNIQUE: Multidetector CT imaging of the lumbar spine was performed without intravenous contrast administration. Multiplanar CT image reconstructions were also generated. RADIATION DOSE REDUCTION: This exam was performed according to the departmental dose-optimization program  which includes automated exposure control, adjustment of the mA and/or kV according to patient size and/or use of iterative reconstruction technique. COMPARISON:  None Available. FINDINGS: Segmentation: 5 lumbar type vertebrae. Alignment: Lumbar lordosis is maintained. Trace retrolisthesis of L1 on L2. Subtle grade 1 anterolisthesis of L5 on S1. Mild to moderate dextrocurvature of the lumbar spine centered at L3-4. Vertebrae: There is no compression fracture or displaced fracture within the lumbar spine. There is a displaced fracture of the posterior right twelfth rib with 1 full shaft with displacement. No suspicious osseous lesion. Degenerative endplate changes and Schmorl's nodes at multiple levels. Mild degenerative changes of the bilateral sacroiliac joints. Paraspinal and other soft tissues: The visualized paraspinal soft tissues are unremarkable. Extensive atherosclerosis of the abdominal aorta and branch vessels. Cholelithiasis  noted. 1.1 cm hyperattenuating lesion in the medial right kidney. Additional cyst in the lower pole of the right kidney. Vascular calcification versus nonobstructing calculus in the left kidney. Atelectasis at the right lung base. Multiple cysts in the visualized portion of the right hepatic lobe. Disc levels: Disc space narrowing at multiple levels most pronounced along the left aspect of L3-4 and the right aspect of L5-S1. Vacuum disc phenomenon at multiple levels. Disc bulges at multiple levels in the lumbar spine. Disc bulge and posterior osteophytes along with facet arthrosis at L3-4 resulting in mild spinal canal stenosis. Additional disc bulge, facet arthrosis, and thickening of the ligamentum flavum at L4-5 resulting in mild spinal canal stenosis. There is severe foraminal narrowing on the right at L5-S1. IMPRESSION: No acute fracture or traumatic malalignment of the lumbar spine. Displaced fracture of the posterior right twelfth rib. Degenerative changes of the lumbar spine as above. 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 Renal and hepatic cysts noted. 1.1 cm hyperattenuating lesion along the medial right kidney favored to reflect a cyst containing a hemorrhage or proteinaceous debris. Consider nonemergent correlation with renal ultrasound. Cholelithiasis. Electronically Signed   By: Denny Flack M.D.   On: 03/31/2024 20:05   CT Head Wo Contrast Result Date: 03/31/2024 CLINICAL DATA:  Mental status change, multiple falls since leaving the hospital on 05/03. EXAM: CT HEAD WITHOUT CONTRAST TECHNIQUE: Contiguous axial images were obtained from the base of the skull through the vertex without intravenous contrast. RADIATION DOSE REDUCTION: This exam was performed according to the departmental dose-optimization program which includes automated exposure control, adjustment of the mA and/or kV according to patient size and/or use of iterative reconstruction technique. COMPARISON:  CT head 03/25/2024.  FINDINGS: Brain: No acute intracranial hemorrhage. No CT evidence of acute infarct. Nonspecific hypoattenuation in the periventricular and subcortical white matter favored to reflect chronic microvascular ischemic changes. No edema, mass effect, or midline shift. The basilar cisterns are patent. Ventricles: The ventricles are normal. Vascular: Atherosclerotic calcifications of the carotid siphons. No hyperdense vessel. Skull: No acute or aggressive finding. Orbits: Orbits are symmetric. Sinuses: The visualized paranasal sinuses are clear. Other: Mastoid air cells are clear. IMPRESSION: No CT evidence of acute intracranial abnormality. Electronically Signed   By: Denny Flack M.D.   On: 03/31/2024 19:52   CT Head Wo Contrast Result Date: 03/25/2024 CLINICAL DATA:  Head trauma, minor (Age >= 65y) EXAM: CT HEAD WITHOUT CONTRAST TECHNIQUE: Contiguous axial images were obtained from the base of the skull through the vertex without intravenous contrast. RADIATION DOSE REDUCTION: This exam was performed according to the departmental dose-optimization program which includes automated exposure control, adjustment of the mA and/or kV according to patient size  and/or use of iterative reconstruction technique. COMPARISON:  CT head February 10, 23. FINDINGS: Brain: No evidence of acute infarction, hemorrhage, hydrocephalus, extra-axial collection or mass lesion/mass effect. Vascular: No hyperdense vessel. Skull: No acute fracture. Sinuses/Orbits: No acute orbital findings.  Clear sinuses. Other: No mastoid effusions. IMPRESSION: No evidence of acute intracranial abnormality. Electronically Signed   By: Stevenson Elbe M.D.   On: 03/25/2024 20:19   DG Pelvis Portable Result Date: 03/25/2024 CLINICAL DATA:  Fall, right hip pain EXAM: PORTABLE PELVIS 1-2 VIEWS COMPARISON:  None Available. FINDINGS: Degenerative changes in the hips bilaterally, left greater than right. No proximal femoral fracture. There is a fracture through  the right inferior pubic ramus. No subluxation or dislocation. IMPRESSION: Right inferior pubic ramus fracture. Electronically Signed   By: Janeece Mechanic M.D.   On: 03/25/2024 20:08   DG Ribs Unilateral W/Chest Right Result Date: 03/25/2024 CLINICAL DATA:  Right rib pain.  Fall. EXAM: RIGHT RIBS AND CHEST - 3+ VIEW COMPARISON:  08/21/2022 FINDINGS: Lateral right 9th rib fracture. No associated effusion or pneumothorax. Minimal right base atelectasis. Heart mediastinal contours within normal limits. Aortic atherosclerosis. IMPRESSION: Lateral right 9th rib fracture. No associated effusion or pneumothorax. Right base atelectasis. Electronically Signed   By: Janeece Mechanic M.D.   On: 03/25/2024 20:07      Discharge Exam: Vitals:   04/05/24 1919 04/06/24 0402  BP: 137/63 (!) 126/43  Pulse: 70 70  Resp: 20 14  Temp: 98.1 F (36.7 C) 97.9 F (36.6 C)  SpO2: 100% 97%    General: Pt is alert, awake, not in acute distress Cardiovascular: RRR, S1/S2 +, no edema Respiratory: CTA bilaterally, no wheezing, no rhonchi, no respiratory distress, no conversational dyspnea  Abdominal: Soft, NT, ND, bowel sounds + Extremities: no edema, no cyanosis Psych: Normal mood and affect, stable judgement and insight     The results of significant diagnostics from this hospitalization (including imaging, microbiology, ancillary and laboratory) are listed below for reference.     Microbiology: No results found for this or any previous visit (from the past 240 hours).   Labs: BNP (last 3 results) No results for input(s): "BNP" in the last 8760 hours. Basic Metabolic Panel: Recent Labs  Lab 03/31/24 1829 04/01/24 0427 04/02/24 0435 04/03/24 0920 04/05/24 0414  NA 136 138 135 138 136  K 4.5 4.0 3.6 3.7 4.4  CL 112* 115* 110 111 106  CO2 12* 16* 17* 20* 22  GLUCOSE 129* 157* 207* 156* 153*  BUN 73* 58* 37* 24* 27*  CREATININE 2.25* 1.62* 1.44* 1.15* 1.18*  CALCIUM  10.6* 10.3 10.6* 10.8* 10.5*    Liver Function Tests: Recent Labs  Lab 04/02/24 0435 04/05/24 0414  AST 21 22  ALT 21 23  ALKPHOS 96 118  BILITOT 0.4 0.7  PROT 5.6* 6.1*  ALBUMIN 3.1* 3.2*   No results for input(s): "LIPASE", "AMYLASE" in the last 168 hours. No results for input(s): "AMMONIA" in the last 168 hours. CBC: Recent Labs  Lab 03/31/24 1829 04/01/24 0427 04/02/24 0435 04/05/24 0414  WBC 6.9 6.1 4.8 6.1  NEUTROABS 5.4  --   --   --   HGB 11.9* 11.2* 10.3* 10.3*  HCT 36.1 33.8* 32.3* 31.8*  MCV 96.8 97.4 97.6 97.2  PLT 218 189 172 182   Cardiac Enzymes: No results for input(s): "CKTOTAL", "CKMB", "CKMBINDEX", "TROPONINI" in the last 168 hours. BNP: Invalid input(s): "POCBNP" CBG: Recent Labs  Lab 03/31/24 2220  GLUCAP 108*   D-Dimer No  results for input(s): "DDIMER" in the last 72 hours. Hgb A1c No results for input(s): "HGBA1C" in the last 72 hours. Lipid Profile No results for input(s): "CHOL", "HDL", "LDLCALC", "TRIG", "CHOLHDL", "LDLDIRECT" in the last 72 hours. Thyroid function studies No results for input(s): "TSH", "T4TOTAL", "T3FREE", "THYROIDAB" in the last 72 hours.  Invalid input(s): "FREET3" Anemia work up No results for input(s): "VITAMINB12", "FOLATE", "FERRITIN", "TIBC", "IRON", "RETICCTPCT" in the last 72 hours. Urinalysis    Component Value Date/Time   COLORURINE YELLOW 03/31/2024 1502   APPEARANCEUR HAZY (A) 03/31/2024 1502   LABSPEC 1.020 03/31/2024 1502   PHURINE 5.0 03/31/2024 1502   GLUCOSEU NEGATIVE 03/31/2024 1502   HGBUR NEGATIVE 03/31/2024 1502   BILIRUBINUR NEGATIVE 03/31/2024 1502   KETONESUR 5 (A) 03/31/2024 1502   PROTEINUR NEGATIVE 03/31/2024 1502   NITRITE NEGATIVE 03/31/2024 1502   LEUKOCYTESUR NEGATIVE 03/31/2024 1502   Sepsis Labs Recent Labs  Lab 03/31/24 1829 04/01/24 0427 04/02/24 0435 04/05/24 0414  WBC 6.9 6.1 4.8 6.1   Microbiology No results found for this or any previous visit (from the past 240 hours).   Patient was  seen and examined on the day of discharge and was found to be in stable condition. Time coordinating discharge: 35 minutes including assessment and coordination of care, as well as examination of the patient.   SIGNED:  Daren Eck, DO [facility] 04/06/2024, 10:29 AM

## 2024-04-11 DIAGNOSIS — E785 Hyperlipidemia, unspecified: Secondary | ICD-10-CM | POA: Diagnosis not present

## 2024-04-11 DIAGNOSIS — S32810D Multiple fractures of pelvis with stable disruption of pelvic ring, subsequent encounter for fracture with routine healing: Secondary | ICD-10-CM | POA: Diagnosis not present

## 2024-04-11 DIAGNOSIS — N1831 Chronic kidney disease, stage 3a: Secondary | ICD-10-CM | POA: Diagnosis not present

## 2024-04-11 DIAGNOSIS — R001 Bradycardia, unspecified: Secondary | ICD-10-CM | POA: Diagnosis not present

## 2024-04-11 DIAGNOSIS — I129 Hypertensive chronic kidney disease with stage 1 through stage 4 chronic kidney disease, or unspecified chronic kidney disease: Secondary | ICD-10-CM | POA: Diagnosis not present

## 2024-04-11 DIAGNOSIS — N179 Acute kidney failure, unspecified: Secondary | ICD-10-CM | POA: Diagnosis not present

## 2024-04-11 DIAGNOSIS — E559 Vitamin D deficiency, unspecified: Secondary | ICD-10-CM | POA: Diagnosis not present

## 2024-04-11 DIAGNOSIS — S22079D Unspecified fracture of T9-T10 vertebra, subsequent encounter for fracture with routine healing: Secondary | ICD-10-CM | POA: Diagnosis not present

## 2024-04-11 DIAGNOSIS — R296 Repeated falls: Secondary | ICD-10-CM | POA: Diagnosis not present

## 2024-04-11 DIAGNOSIS — K5900 Constipation, unspecified: Secondary | ICD-10-CM | POA: Diagnosis not present

## 2024-04-11 DIAGNOSIS — E119 Type 2 diabetes mellitus without complications: Secondary | ICD-10-CM | POA: Diagnosis not present

## 2024-04-13 DIAGNOSIS — N1832 Chronic kidney disease, stage 3b: Secondary | ICD-10-CM | POA: Diagnosis not present

## 2024-04-13 DIAGNOSIS — E119 Type 2 diabetes mellitus without complications: Secondary | ICD-10-CM | POA: Diagnosis not present

## 2024-04-13 DIAGNOSIS — I10 Essential (primary) hypertension: Secondary | ICD-10-CM | POA: Diagnosis not present

## 2024-04-13 DIAGNOSIS — S2231XA Fracture of one rib, right side, initial encounter for closed fracture: Secondary | ICD-10-CM | POA: Diagnosis not present

## 2024-04-13 DIAGNOSIS — S32599A Other specified fracture of unspecified pubis, initial encounter for closed fracture: Secondary | ICD-10-CM | POA: Diagnosis not present

## 2024-04-13 DIAGNOSIS — R5381 Other malaise: Secondary | ICD-10-CM | POA: Diagnosis not present

## 2024-04-18 DIAGNOSIS — N189 Chronic kidney disease, unspecified: Secondary | ICD-10-CM | POA: Diagnosis not present

## 2024-04-18 DIAGNOSIS — N179 Acute kidney failure, unspecified: Secondary | ICD-10-CM | POA: Diagnosis not present

## 2024-04-18 DIAGNOSIS — F17210 Nicotine dependence, cigarettes, uncomplicated: Secondary | ICD-10-CM | POA: Diagnosis not present

## 2024-04-18 DIAGNOSIS — E119 Type 2 diabetes mellitus without complications: Secondary | ICD-10-CM | POA: Diagnosis not present

## 2024-04-18 DIAGNOSIS — G9341 Metabolic encephalopathy: Secondary | ICD-10-CM | POA: Diagnosis not present

## 2024-04-18 DIAGNOSIS — E785 Hyperlipidemia, unspecified: Secondary | ICD-10-CM | POA: Diagnosis not present

## 2024-04-18 DIAGNOSIS — N1832 Chronic kidney disease, stage 3b: Secondary | ICD-10-CM | POA: Diagnosis not present

## 2024-04-18 DIAGNOSIS — S2241XA Multiple fractures of ribs, right side, initial encounter for closed fracture: Secondary | ICD-10-CM | POA: Diagnosis not present

## 2024-04-18 DIAGNOSIS — S32591S Other specified fracture of right pubis, sequela: Secondary | ICD-10-CM | POA: Diagnosis not present

## 2024-04-18 DIAGNOSIS — M6281 Muscle weakness (generalized): Secondary | ICD-10-CM | POA: Diagnosis not present

## 2024-04-18 DIAGNOSIS — I10 Essential (primary) hypertension: Secondary | ICD-10-CM | POA: Diagnosis not present

## 2024-04-18 DIAGNOSIS — E872 Acidosis, unspecified: Secondary | ICD-10-CM | POA: Diagnosis not present

## 2024-04-20 DIAGNOSIS — N1831 Chronic kidney disease, stage 3a: Secondary | ICD-10-CM | POA: Diagnosis not present

## 2024-04-20 DIAGNOSIS — E119 Type 2 diabetes mellitus without complications: Secondary | ICD-10-CM | POA: Diagnosis not present

## 2024-04-20 DIAGNOSIS — S32591S Other specified fracture of right pubis, sequela: Secondary | ICD-10-CM | POA: Diagnosis not present

## 2024-04-20 DIAGNOSIS — M6281 Muscle weakness (generalized): Secondary | ICD-10-CM | POA: Diagnosis not present

## 2024-04-20 DIAGNOSIS — I129 Hypertensive chronic kidney disease with stage 1 through stage 4 chronic kidney disease, or unspecified chronic kidney disease: Secondary | ICD-10-CM | POA: Diagnosis not present

## 2024-04-20 DIAGNOSIS — R296 Repeated falls: Secondary | ICD-10-CM | POA: Diagnosis not present

## 2024-04-20 DIAGNOSIS — E785 Hyperlipidemia, unspecified: Secondary | ICD-10-CM | POA: Diagnosis not present

## 2024-04-20 DIAGNOSIS — S22079D Unspecified fracture of T9-T10 vertebra, subsequent encounter for fracture with routine healing: Secondary | ICD-10-CM | POA: Diagnosis not present

## 2024-04-20 DIAGNOSIS — S2241XA Multiple fractures of ribs, right side, initial encounter for closed fracture: Secondary | ICD-10-CM | POA: Diagnosis not present

## 2024-04-21 DIAGNOSIS — S32591D Other specified fracture of right pubis, subsequent encounter for fracture with routine healing: Secondary | ICD-10-CM | POA: Diagnosis not present

## 2024-04-21 DIAGNOSIS — N1831 Chronic kidney disease, stage 3a: Secondary | ICD-10-CM | POA: Diagnosis not present

## 2024-04-21 DIAGNOSIS — E1122 Type 2 diabetes mellitus with diabetic chronic kidney disease: Secondary | ICD-10-CM | POA: Diagnosis not present

## 2024-04-21 DIAGNOSIS — I700 Atherosclerosis of aorta: Secondary | ICD-10-CM | POA: Diagnosis not present

## 2024-04-21 DIAGNOSIS — I129 Hypertensive chronic kidney disease with stage 1 through stage 4 chronic kidney disease, or unspecified chronic kidney disease: Secondary | ICD-10-CM | POA: Diagnosis not present

## 2024-04-21 DIAGNOSIS — S2241XD Multiple fractures of ribs, right side, subsequent encounter for fracture with routine healing: Secondary | ICD-10-CM | POA: Diagnosis not present

## 2024-04-26 DIAGNOSIS — S32599D Other specified fracture of unspecified pubis, subsequent encounter for fracture with routine healing: Secondary | ICD-10-CM | POA: Diagnosis not present

## 2024-04-26 DIAGNOSIS — I6523 Occlusion and stenosis of bilateral carotid arteries: Secondary | ICD-10-CM | POA: Diagnosis not present

## 2024-04-26 DIAGNOSIS — R296 Repeated falls: Secondary | ICD-10-CM | POA: Diagnosis not present

## 2024-04-26 DIAGNOSIS — E7800 Pure hypercholesterolemia, unspecified: Secondary | ICD-10-CM | POA: Diagnosis not present

## 2024-04-26 DIAGNOSIS — I5022 Chronic systolic (congestive) heart failure: Secondary | ICD-10-CM | POA: Diagnosis not present

## 2024-04-26 DIAGNOSIS — R911 Solitary pulmonary nodule: Secondary | ICD-10-CM | POA: Diagnosis not present

## 2024-04-26 DIAGNOSIS — N1832 Chronic kidney disease, stage 3b: Secondary | ICD-10-CM | POA: Diagnosis not present

## 2024-04-26 DIAGNOSIS — S2241XD Multiple fractures of ribs, right side, subsequent encounter for fracture with routine healing: Secondary | ICD-10-CM | POA: Diagnosis not present

## 2024-04-26 DIAGNOSIS — E1122 Type 2 diabetes mellitus with diabetic chronic kidney disease: Secondary | ICD-10-CM | POA: Diagnosis not present

## 2024-04-26 DIAGNOSIS — N1831 Chronic kidney disease, stage 3a: Secondary | ICD-10-CM | POA: Diagnosis not present

## 2024-04-26 DIAGNOSIS — I129 Hypertensive chronic kidney disease with stage 1 through stage 4 chronic kidney disease, or unspecified chronic kidney disease: Secondary | ICD-10-CM | POA: Diagnosis not present

## 2024-04-26 DIAGNOSIS — E041 Nontoxic single thyroid nodule: Secondary | ICD-10-CM | POA: Diagnosis not present

## 2024-04-26 DIAGNOSIS — E1165 Type 2 diabetes mellitus with hyperglycemia: Secondary | ICD-10-CM | POA: Diagnosis not present

## 2024-04-26 DIAGNOSIS — S32591D Other specified fracture of right pubis, subsequent encounter for fracture with routine healing: Secondary | ICD-10-CM | POA: Diagnosis not present

## 2024-04-26 DIAGNOSIS — I10 Essential (primary) hypertension: Secondary | ICD-10-CM | POA: Diagnosis not present

## 2024-04-26 DIAGNOSIS — I700 Atherosclerosis of aorta: Secondary | ICD-10-CM | POA: Diagnosis not present

## 2024-04-28 DIAGNOSIS — E1122 Type 2 diabetes mellitus with diabetic chronic kidney disease: Secondary | ICD-10-CM | POA: Diagnosis not present

## 2024-04-28 DIAGNOSIS — N1831 Chronic kidney disease, stage 3a: Secondary | ICD-10-CM | POA: Diagnosis not present

## 2024-04-28 DIAGNOSIS — I129 Hypertensive chronic kidney disease with stage 1 through stage 4 chronic kidney disease, or unspecified chronic kidney disease: Secondary | ICD-10-CM | POA: Diagnosis not present

## 2024-04-28 DIAGNOSIS — S32591D Other specified fracture of right pubis, subsequent encounter for fracture with routine healing: Secondary | ICD-10-CM | POA: Diagnosis not present

## 2024-04-28 DIAGNOSIS — I700 Atherosclerosis of aorta: Secondary | ICD-10-CM | POA: Diagnosis not present

## 2024-04-28 DIAGNOSIS — S2241XD Multiple fractures of ribs, right side, subsequent encounter for fracture with routine healing: Secondary | ICD-10-CM | POA: Diagnosis not present

## 2024-05-02 DIAGNOSIS — S32591D Other specified fracture of right pubis, subsequent encounter for fracture with routine healing: Secondary | ICD-10-CM | POA: Diagnosis not present

## 2024-05-02 DIAGNOSIS — I129 Hypertensive chronic kidney disease with stage 1 through stage 4 chronic kidney disease, or unspecified chronic kidney disease: Secondary | ICD-10-CM | POA: Diagnosis not present

## 2024-05-02 DIAGNOSIS — S2241XD Multiple fractures of ribs, right side, subsequent encounter for fracture with routine healing: Secondary | ICD-10-CM | POA: Diagnosis not present

## 2024-05-02 DIAGNOSIS — E1122 Type 2 diabetes mellitus with diabetic chronic kidney disease: Secondary | ICD-10-CM | POA: Diagnosis not present

## 2024-05-02 DIAGNOSIS — I700 Atherosclerosis of aorta: Secondary | ICD-10-CM | POA: Diagnosis not present

## 2024-05-02 DIAGNOSIS — N1831 Chronic kidney disease, stage 3a: Secondary | ICD-10-CM | POA: Diagnosis not present

## 2024-05-04 DIAGNOSIS — S32591D Other specified fracture of right pubis, subsequent encounter for fracture with routine healing: Secondary | ICD-10-CM | POA: Diagnosis not present

## 2024-05-04 DIAGNOSIS — S2241XD Multiple fractures of ribs, right side, subsequent encounter for fracture with routine healing: Secondary | ICD-10-CM | POA: Diagnosis not present

## 2024-05-04 DIAGNOSIS — N1831 Chronic kidney disease, stage 3a: Secondary | ICD-10-CM | POA: Diagnosis not present

## 2024-05-04 DIAGNOSIS — E1122 Type 2 diabetes mellitus with diabetic chronic kidney disease: Secondary | ICD-10-CM | POA: Diagnosis not present

## 2024-05-04 DIAGNOSIS — I129 Hypertensive chronic kidney disease with stage 1 through stage 4 chronic kidney disease, or unspecified chronic kidney disease: Secondary | ICD-10-CM | POA: Diagnosis not present

## 2024-05-04 DIAGNOSIS — I700 Atherosclerosis of aorta: Secondary | ICD-10-CM | POA: Diagnosis not present

## 2024-05-08 DIAGNOSIS — E1122 Type 2 diabetes mellitus with diabetic chronic kidney disease: Secondary | ICD-10-CM | POA: Diagnosis not present

## 2024-05-08 DIAGNOSIS — I700 Atherosclerosis of aorta: Secondary | ICD-10-CM | POA: Diagnosis not present

## 2024-05-08 DIAGNOSIS — I129 Hypertensive chronic kidney disease with stage 1 through stage 4 chronic kidney disease, or unspecified chronic kidney disease: Secondary | ICD-10-CM | POA: Diagnosis not present

## 2024-05-08 DIAGNOSIS — N1831 Chronic kidney disease, stage 3a: Secondary | ICD-10-CM | POA: Diagnosis not present

## 2024-05-08 DIAGNOSIS — S32591D Other specified fracture of right pubis, subsequent encounter for fracture with routine healing: Secondary | ICD-10-CM | POA: Diagnosis not present

## 2024-05-08 DIAGNOSIS — S2241XD Multiple fractures of ribs, right side, subsequent encounter for fracture with routine healing: Secondary | ICD-10-CM | POA: Diagnosis not present

## 2024-05-16 DIAGNOSIS — E1122 Type 2 diabetes mellitus with diabetic chronic kidney disease: Secondary | ICD-10-CM | POA: Diagnosis not present

## 2024-05-16 DIAGNOSIS — S2241XD Multiple fractures of ribs, right side, subsequent encounter for fracture with routine healing: Secondary | ICD-10-CM | POA: Diagnosis not present

## 2024-05-16 DIAGNOSIS — N1831 Chronic kidney disease, stage 3a: Secondary | ICD-10-CM | POA: Diagnosis not present

## 2024-05-16 DIAGNOSIS — I129 Hypertensive chronic kidney disease with stage 1 through stage 4 chronic kidney disease, or unspecified chronic kidney disease: Secondary | ICD-10-CM | POA: Diagnosis not present

## 2024-05-16 DIAGNOSIS — I700 Atherosclerosis of aorta: Secondary | ICD-10-CM | POA: Diagnosis not present

## 2024-05-16 DIAGNOSIS — S32591D Other specified fracture of right pubis, subsequent encounter for fracture with routine healing: Secondary | ICD-10-CM | POA: Diagnosis not present

## 2024-06-01 ENCOUNTER — Encounter: Payer: Self-pay | Admitting: Nephrology

## 2024-06-01 DIAGNOSIS — I129 Hypertensive chronic kidney disease with stage 1 through stage 4 chronic kidney disease, or unspecified chronic kidney disease: Secondary | ICD-10-CM | POA: Diagnosis not present

## 2024-06-01 DIAGNOSIS — N1832 Chronic kidney disease, stage 3b: Secondary | ICD-10-CM | POA: Diagnosis not present

## 2024-06-01 DIAGNOSIS — D631 Anemia in chronic kidney disease: Secondary | ICD-10-CM | POA: Diagnosis not present

## 2024-06-01 DIAGNOSIS — N189 Chronic kidney disease, unspecified: Secondary | ICD-10-CM | POA: Diagnosis not present

## 2024-06-01 DIAGNOSIS — E559 Vitamin D deficiency, unspecified: Secondary | ICD-10-CM | POA: Diagnosis not present

## 2024-06-06 ENCOUNTER — Other Ambulatory Visit: Payer: Self-pay | Admitting: Nephrology

## 2024-06-06 DIAGNOSIS — N1832 Chronic kidney disease, stage 3b: Secondary | ICD-10-CM

## 2024-07-05 DIAGNOSIS — H3589 Other specified retinal disorders: Secondary | ICD-10-CM | POA: Diagnosis not present
